# Patient Record
Sex: Female | Born: 1960 | Race: Black or African American | Hispanic: No | Smoking: Never smoker
Health system: Southern US, Community
[De-identification: ages and names within clinical notes are randomized; demographics above are authoritative.]

## PROBLEM LIST (undated history)

## (undated) DIAGNOSIS — M1990 Unspecified osteoarthritis, unspecified site: Secondary | ICD-10-CM

---

## 2001-02-05 ENCOUNTER — Ambulatory Visit (HOSPITAL_COMMUNITY): Admission: RE | Admit: 2001-02-05 | Discharge: 2001-02-05 | Payer: Self-pay | Admitting: Family Medicine

## 2001-02-05 ENCOUNTER — Encounter: Payer: Self-pay | Admitting: Family Medicine

## 2020-04-27 ENCOUNTER — Emergency Department (HOSPITAL_COMMUNITY): Payer: HRSA Program

## 2020-04-27 ENCOUNTER — Inpatient Hospital Stay (HOSPITAL_COMMUNITY)
Admission: EM | Admit: 2020-04-27 | Discharge: 2020-06-15 | DRG: 177 | Disposition: E | Payer: HRSA Program | Attending: Internal Medicine | Admitting: Internal Medicine

## 2020-04-27 ENCOUNTER — Ambulatory Visit
Admission: EM | Admit: 2020-04-27 | Discharge: 2020-04-27 | Disposition: A | Discharge status: Home / Self Care | Payer: Self-pay

## 2020-04-27 ENCOUNTER — Encounter: Payer: Self-pay | Admitting: Emergency Medicine

## 2020-04-27 ENCOUNTER — Encounter (HOSPITAL_COMMUNITY): Payer: Self-pay

## 2020-04-27 DIAGNOSIS — E669 Obesity, unspecified: Secondary | ICD-10-CM | POA: Diagnosis not present

## 2020-04-27 DIAGNOSIS — Z515 Encounter for palliative care: Secondary | ICD-10-CM

## 2020-04-27 DIAGNOSIS — J1282 Pneumonia due to coronavirus disease 2019: Secondary | ICD-10-CM | POA: Diagnosis present

## 2020-04-27 DIAGNOSIS — L89322 Pressure ulcer of left buttock, stage 2: Secondary | ICD-10-CM | POA: Diagnosis not present

## 2020-04-27 DIAGNOSIS — R6510 Systemic inflammatory response syndrome (SIRS) of non-infectious origin without acute organ dysfunction: Secondary | ICD-10-CM

## 2020-04-27 DIAGNOSIS — N179 Acute kidney failure, unspecified: Secondary | ICD-10-CM | POA: Diagnosis present

## 2020-04-27 DIAGNOSIS — L8990 Pressure ulcer of unspecified site, unspecified stage: Secondary | ICD-10-CM | POA: Insufficient documentation

## 2020-04-27 DIAGNOSIS — R32 Unspecified urinary incontinence: Secondary | ICD-10-CM | POA: Diagnosis not present

## 2020-04-27 DIAGNOSIS — Z66 Do not resuscitate: Secondary | ICD-10-CM | POA: Diagnosis not present

## 2020-04-27 DIAGNOSIS — F419 Anxiety disorder, unspecified: Secondary | ICD-10-CM | POA: Diagnosis present

## 2020-04-27 DIAGNOSIS — Z7189 Other specified counseling: Secondary | ICD-10-CM | POA: Diagnosis not present

## 2020-04-27 DIAGNOSIS — R7401 Elevation of levels of liver transaminase levels: Secondary | ICD-10-CM | POA: Diagnosis present

## 2020-04-27 DIAGNOSIS — Z6841 Body Mass Index (BMI) 40.0 and over, adult: Secondary | ICD-10-CM | POA: Diagnosis not present

## 2020-04-27 DIAGNOSIS — E860 Dehydration: Secondary | ICD-10-CM | POA: Diagnosis present

## 2020-04-27 DIAGNOSIS — Z880 Allergy status to penicillin: Secondary | ICD-10-CM

## 2020-04-27 DIAGNOSIS — L89312 Pressure ulcer of right buttock, stage 2: Secondary | ICD-10-CM | POA: Diagnosis not present

## 2020-04-27 DIAGNOSIS — M1990 Unspecified osteoarthritis, unspecified site: Secondary | ICD-10-CM | POA: Diagnosis present

## 2020-04-27 DIAGNOSIS — J9601 Acute respiratory failure with hypoxia: Secondary | ICD-10-CM | POA: Diagnosis present

## 2020-04-27 DIAGNOSIS — R5381 Other malaise: Secondary | ICD-10-CM | POA: Diagnosis present

## 2020-04-27 DIAGNOSIS — R197 Diarrhea, unspecified: Secondary | ICD-10-CM | POA: Diagnosis present

## 2020-04-27 DIAGNOSIS — R7989 Other specified abnormal findings of blood chemistry: Secondary | ICD-10-CM | POA: Diagnosis present

## 2020-04-27 DIAGNOSIS — R799 Abnormal finding of blood chemistry, unspecified: Secondary | ICD-10-CM

## 2020-04-27 DIAGNOSIS — E871 Hypo-osmolality and hyponatremia: Secondary | ICD-10-CM | POA: Diagnosis present

## 2020-04-27 DIAGNOSIS — J9600 Acute respiratory failure, unspecified whether with hypoxia or hypercapnia: Secondary | ICD-10-CM | POA: Diagnosis not present

## 2020-04-27 DIAGNOSIS — U071 COVID-19: Secondary | ICD-10-CM | POA: Diagnosis present

## 2020-04-27 HISTORY — DX: Unspecified osteoarthritis, unspecified site: M19.90

## 2020-04-27 LAB — CBC WITH DIFFERENTIAL/PLATELET
Abs Immature Granulocytes: 0.06 10*3/uL (ref 0.00–0.07)
Basophils Absolute: 0 10*3/uL (ref 0.0–0.1)
Basophils Relative: 0 %
Eosinophils Absolute: 0 10*3/uL (ref 0.0–0.5)
Eosinophils Relative: 0 %
HCT: 40.7 % (ref 36.0–46.0)
Hemoglobin: 12.9 g/dL (ref 12.0–15.0)
Immature Granulocytes: 1 %
Lymphocytes Relative: 14 %
Lymphs Abs: 0.9 10*3/uL (ref 0.7–4.0)
MCH: 29.2 pg (ref 26.0–34.0)
MCHC: 31.7 g/dL (ref 30.0–36.0)
MCV: 92.1 fL (ref 80.0–100.0)
Monocytes Absolute: 0.3 10*3/uL (ref 0.1–1.0)
Monocytes Relative: 5 %
Neutro Abs: 4.9 10*3/uL (ref 1.7–7.7)
Neutrophils Relative %: 80 %
Platelets: 155 10*3/uL (ref 150–400)
RBC: 4.42 MIL/uL (ref 3.87–5.11)
RDW: 13.8 % (ref 11.5–15.5)
WBC: 6.1 10*3/uL (ref 4.0–10.5)
nRBC: 0 % (ref 0.0–0.2)

## 2020-04-27 LAB — COMPREHENSIVE METABOLIC PANEL
ALT: 28 U/L (ref 0–44)
AST: 67 U/L — ABNORMAL HIGH (ref 15–41)
Albumin: 2.8 g/dL — ABNORMAL LOW (ref 3.5–5.0)
Alkaline Phosphatase: 43 U/L (ref 38–126)
Anion gap: 11 (ref 5–15)
BUN: 25 mg/dL — ABNORMAL HIGH (ref 6–20)
CO2: 24 mmol/L (ref 22–32)
Calcium: 7.9 mg/dL — ABNORMAL LOW (ref 8.9–10.3)
Chloride: 99 mmol/L (ref 98–111)
Creatinine, Ser: 1.36 mg/dL — ABNORMAL HIGH (ref 0.44–1.00)
GFR calc Af Amer: 49 mL/min — ABNORMAL LOW (ref >60)
GFR calc non Af Amer: 42 mL/min — ABNORMAL LOW (ref >60)
Glucose, Bld: 125 mg/dL — ABNORMAL HIGH (ref 70–99)
Potassium: 3.8 mmol/L (ref 3.5–5.1)
Sodium: 134 mmol/L — ABNORMAL LOW (ref 135–145)
Total Bilirubin: 0.4 mg/dL (ref 0.3–1.2)
Total Protein: 7.6 g/dL (ref 6.5–8.1)

## 2020-04-27 LAB — TRIGLYCERIDES: Triglycerides: 103 mg/dL (ref <150)

## 2020-04-27 LAB — D-DIMER, QUANTITATIVE: D-Dimer, Quant: 1.7 ug/mL-FEU — ABNORMAL HIGH (ref 0.00–0.50)

## 2020-04-27 LAB — FIBRINOGEN: Fibrinogen: 706 mg/dL — ABNORMAL HIGH (ref 210–475)

## 2020-04-27 LAB — LACTATE DEHYDROGENASE: LDH: 553 U/L — ABNORMAL HIGH (ref 98–192)

## 2020-04-27 LAB — LACTIC ACID, PLASMA: Lactic Acid, Venous: 1.7 mmol/L (ref 0.5–1.9)

## 2020-04-27 LAB — SARS CORONAVIRUS 2 BY RT PCR (HOSPITAL ORDER, PERFORMED IN ~~LOC~~ HOSPITAL LAB): SARS Coronavirus 2: POSITIVE — AB

## 2020-04-27 LAB — PROCALCITONIN: Procalcitonin: 0.27 ng/mL

## 2020-04-27 LAB — FERRITIN: Ferritin: 1457 ng/mL — ABNORMAL HIGH (ref 11–307)

## 2020-04-27 LAB — C-REACTIVE PROTEIN: CRP: 22.1 mg/dL — ABNORMAL HIGH (ref <1.0)

## 2020-04-27 MED ORDER — SODIUM CHLORIDE 0.9 % IV SOLN
100.0000 mg | INTRAVENOUS | Status: AC
  Administered 2020-04-27 (×2): 100 mg via INTRAVENOUS
  Filled 2020-04-27: qty 20

## 2020-04-27 MED ORDER — ACETAMINOPHEN 325 MG PO TABS: 650.0000 mg | ORAL_TABLET | Freq: Four times a day (QID) | ORAL | Status: DC | PRN

## 2020-04-27 MED ORDER — SODIUM CHLORIDE 0.9 % IV SOLN
100.0000 mg | Freq: Every day | INTRAVENOUS | Status: AC
  Administered 2020-04-28 – 2020-05-01 (×4): 100 mg via INTRAVENOUS
  Filled 2020-04-27 (×5): qty 20

## 2020-04-27 MED ORDER — DEXAMETHASONE SODIUM PHOSPHATE 10 MG/ML IJ SOLN
6.0000 mg | Freq: Once | INTRAMUSCULAR | Status: AC
  Administered 2020-04-27: 6 mg via INTRAVENOUS
  Filled 2020-04-27: qty 1

## 2020-04-27 MED ORDER — DM-GUAIFENESIN ER 30-600 MG PO TB12
1.0000 | ORAL_TABLET | Freq: Two times a day (BID) | ORAL | Status: DC
  Administered 2020-04-27 – 2020-05-14 (×32): 1 via ORAL
  Filled 2020-04-27 (×32): qty 1

## 2020-04-27 MED ORDER — SODIUM CHLORIDE 0.9 % IV SOLN: Freq: Once | INTRAVENOUS | Status: AC

## 2020-04-27 MED ORDER — LEVOFLOXACIN IN D5W 750 MG/150ML IV SOLN
750.0000 mg | Freq: Every day | INTRAVENOUS | Status: DC
  Administered 2020-04-28 – 2020-04-30 (×4): 750 mg via INTRAVENOUS
  Filled 2020-04-27 (×4): qty 150

## 2020-04-27 NOTE — H&P (Signed)
History and Physical  [name] RFF:[id number] DOB: 09-Dec-1960 DOA: 05/11/2020  Referring physician: Raeford Razor, MD PCP: Patient, No Pcp Per  Patient coming from: Home  Chief Complaint: Shortness of breath  HPI: Lisa Diaz is a 59 y.o. female with medical history significant for obesity and arthritis who presents to the emergency department via EMS due to a 4-5 days of shortness of breath.  Patient complained of nonproductive cough, fever, decreased appetite, loss of appetite and loss of sense of smell, diarrhea (3-4 episodes daily) and increased work of breathing which resulted in worsening shortness of breath today and she presented to an urgent care for evaluation.  EMS was activated at the urgent care and on arrival, O2 sat was noted to be 60% on room air, she was placed on supplemental oxygen via NRB at 15 L with improvement in O2 sat to 95%.  Patient was taken to the ED for further evaluation management.  Patient states that she has not had Covid vaccine.  ED Course:  In the emergency department, she was noted to be febrile, tachycardic and tachypneic.  Work-up in the ED showed normal CBC, BUN/creatinine 25/1.36, ferritin 1457, CRP 221, AST 67, Albumin 2.8, LDH 553, D-dimer 1.70, fibrinogen 706, procalcitonin 0.27.  SARS coronavirus 2 was positive.  Chest x-ray showed multiple small ill-defined opacities throughout both lungs with concern for possible multifocal pneumonia.  She was started on IV Decadron and remdesivir.  Hospitalist was asked to admit patient for further evaluation management.  Review of Systems: Constitutional: Negative for chills and fever.  HENT: Negative for ear pain and sore throat.   Eyes: Negative for pain and visual disturbance.  Respiratory: Positive for cough (nonproductive) and shortness of breath.   Cardiovascular: Negative for chest pain and palpitations.  Gastrointestinal: Positive for diarrhea.  Negative for abdominal pain and vomiting.   Endocrine: Negative for polyphagia and polyuria.  Genitourinary: Negative for decreased urine volume, dysuria Musculoskeletal: Negative for arthralgias and back pain.  Skin: Negative for color change and rash.  Allergic/Immunologic: Negative for immunocompromised state.  Neurological: Negative for tremors, syncope, speech difficulty, weakness, light-headedness and headaches.  Hematological: Does not bruise/bleed easily.  All other systems reviewed and are negative   Past Medical History:  Diagnosis Date  . Arthritis    History reviewed. No pertinent surgical history.  Social History:  reports that she has never smoked. She has never used smokeless tobacco. She reports that she does not drink alcohol and does not use drugs.   Allergies  Allergen Reactions  . Penicillins     Did it involve swelling of the face/tongue/throat, SOB, or low BP? yes Did it involve sudden or severe rash/hives, skin peeling, or any reaction on the inside of your mouth or nose? yes Did you need to seek medical attention at a hospital or doctor's office? no When did it last happen?"years ago" If all above answers are "NO", may proceed with cephalosporin use.     No family history on file.   Prior to Admission medications   Medication Sig Start Date End Date Taking? Authorizing Provider  acetaminophen (TYLENOL) 500 MG tablet Take 1,000 mg by mouth every 6 (six) hours as needed for mild pain or fever.   Yes [provider]  ibuprofen (ADVIL) 200 MG tablet Take 800 mg by mouth every 6 (six) hours as needed for fever or moderate pain.   Yes [provider]  naproxen sodium (ALEVE) 220 MG tablet Take 220 mg by  mouth daily as needed (pain/fever).   Yes [provider]    Physical Exam: BP 124/66   Pulse 86   Temp 100.2 F (37.9 C) (Oral)   Resp (!) 36   Ht 5\' 8"  (1.727 m)   Wt (!) 158.8 kg   SpO2 93%   BMI 53.22 kg/m   . General: 59 y.o. year-old female obese and  in no acute distress.  Alert and oriented x3. 46 HEENT: Dry mucous membrane, NCAT, EOMI . Neck: Supple, trachea midline . Cardiovascular: Regular rate and rhythm with no rubs or gallops.  No thyromegaly or JVD noted.  No lower extremity edema. 2/4 pulses in all 4 extremities. Marland Kitchen Respiratory: Tachypnea.  Clear to auscultation with no wheezes or rales.  . Abdomen: Soft nontender nondistended with normal bowel sounds x4 quadrants. . Muskuloskeletal: No cyanosis, clubbing or edema noted bilaterally . Neuro: CN II-XII intact, strength, sensation, reflexes . Skin: No ulcerative lesions noted or rashes . Psychiatry: Judgement and insight appear normal. Mood is appropriate for condition and setting          Labs on Admission:  Basic Metabolic Panel: Recent Labs  Lab 04/22/2020 1920  NA 134*  K 3.8  CL 99  CO2 24  GLUCOSE 125*  BUN 25*  CREATININE 1.36*  CALCIUM 7.9*   Liver Function Tests: Recent Labs  Lab 04/29/2020 1920  AST 67*  ALT 28  ALKPHOS 43  BILITOT 0.4  PROT 7.6  ALBUMIN 2.8*   No results for input(s): LIPASE, AMYLASE in the last 168 hours. No results for input(s): AMMONIA in the last 168 hours. CBC: Recent Labs  Lab 04/20/2020 1920  WBC 6.1  NEUTROABS 4.9  HGB 12.9  HCT 40.7  MCV 92.1  PLT 155   Cardiac Enzymes: No results for input(s): CKTOTAL, CKMB, CKMBINDEX, TROPONINI in the last 168 hours.  BNP (last 3 results) No results for input(s): BNP in the last 8760 hours.  ProBNP (last 3 results) No results for input(s): PROBNP in the last 8760 hours.  CBG: No results for input(s): GLUCAP in the last 168 hours.  Radiological Exams on Admission: DG Chest Port 1 View  Result Date: 04/19/2020 CLINICAL DATA:  Shortness of breath. EXAM: PORTABLE CHEST 1 VIEW COMPARISON:  None. FINDINGS: Mild cardiomegaly is noted. No pneumothorax is noted. Multiple small ill-defined opacities are noted throughout both lungs concerning for possible multifocal pneumonia. Left  pleural effusion cannot be excluded. Bony thorax is unremarkable. IMPRESSION: Multiple small ill-defined opacities are noted throughout both lungs concerning for possible multifocal pneumonia. Left pleural effusion cannot be excluded. Electronically Signed   By: 04/29/2020 M.D.   On: 04/25/2020 19:42    EKG: I independently viewed the EKG done and my findings are as followed: Sinus rhythm at rate of 85 bpm  Assessment/Plan Present on Admission: . Pneumonia due to COVID-19 virus  Principal Problem:   Pneumonia due to COVID-19 virus Active Problems:   Acute respiratory failure with hypoxia (HCC)   Arthritis   Super obese   Elevated serum creatinine   Elevated BUN   SIRS (systemic inflammatory response syndrome) (HCC)   Diarrhea   Elevated AST (SGOT)   Acute respiratory failure with hypoxia secondary to COVID-19 virus pneumonia with possible superimposed CAP POA Chest x-ray showed multiple small ill-defined opacities throughout both lungs with concern for possible multifocal pneumonia Continue albuterol q.6h Continue IV Solu-Medrol  Continue IV Remdesivir per pharmacy protocol Continue vitamin-C 500 mg p.o. Daily Continue zinc  220 mg p.o. Daily Continue Mucinex, Robitussin and Tussionex Continue Tylenol p.r.n. for fever IV levofloxacin will be given due to possible bacteria pneumonia considering elevated procalcitonin (0.27, which is >0.25) Continue supplemental oxygen to maintain O2 sat > or = 94% with plan to wean patient off supplemental oxygen as tolerated (of note, patient does not use oxygen at baseline) Continue incentive spirometry and flutter valve q80min as tolerated Encourage proning, early ambulation, and side laying as tolerated Continue airborne isolation precaution Inflammatory markers: LDH: 553 CRP:  221 D-dimer:1.70 Fibrinogen: 706 Continue monitoring daily inflammatory markers Physician PPE:  Surgical mask with face shield, N-95, nonsterile gloves,  disposable gown, head and shoe cover s Patient PPE:  Face mask   SIRS positive with no convincing evidence of sepsis Patient was noted to be febrile and tachypneic (meet SIRS criteria).  Tachypnea appeared to be driven by patient's hypoxic state and a transitory tachycardia due to fever.  Patient does not appear septic clinically Continue to monitor as per above  Diarrhea possibly secondary to COVID-19 virus infection This is possibly secondary to COVID-19 virus infection, but C. difficile will be obtained with plan to initiate treatment if positive.  Elevated AST AST 67, continue to monitor liver enzymes considering patient being on remdesivir  Elevated BUN/Creatinine with no known history of CKD Dehydration BUN/creatinine -25/1.36; gentle hydration will be provided  Arthritis Continue Tylenol as needed  Super obese (BMI 53.22) Patient will be counseled on diet and exercise when more stable Patient will need to follow-up with primary care physician for weight loss program   DVT prophylaxis: Lovenox  Code Status: Full code  Family Communication: None at bedside  Disposition Plan:  Patient is from:                        home Anticipated DC to:                   SNF or family members home Anticipated DC date:               2-3 days Anticipated DC barriers:         Patient unstable to discharge at this time due to hypoxia respiratory failure secondary to COVID-19 virus pneumonia requiring IV treatment and supplemental oxygen   Consults called: None  Admission status: Inpatient    Frankey Shown MD [facility]  If 7PM-7AM, please contact night-coverage [email or web address] Password [facility]  05/01/2020, 11:06 PM

## 2020-04-27 NOTE — ED Triage Notes (Signed)
Pt came in with severe shortness of breath. R/a o2 sats 60%.  Pt reports her and her kids have recently had a virus. Has not been tested for covid.

## 2020-04-27 NOTE — ED Triage Notes (Signed)
Pt. Arrived via EMS from urgent care. Pt. Complains of SHOB, headache, diarrhea, decreased appetite for 5 days. Per EMS Pts. O2 was 60% on RA at urgent care. Pt. Placed on 15 L of o2 and sats went to 82%. Pt. Currently on 15L non rebreather and O2 sats are 95.

## 2020-04-27 NOTE — ED Notes (Signed)
Patient is being discharged from the Urgent Care and sent to the Emergency Department via ambulance. Per [facility] patient is in need of higher level of care due to Resp failure . Patient is aware and verbalizes understanding of plan of care.  Vitals:   May 18, 2020 1831  BP: 116/75  Pulse: (!) 109  Resp: (!) 29  Temp: (!) 102.9 F (39.4 C)  SpO2: (!) 60%

## 2020-04-27 NOTE — Progress Notes (Signed)
Pharmacy Antibiotic Note  Lisa Diaz is a 59 y.o. female admitted on 05-21-2020 with CAP. Pt also with COVID.  Pharmacy has been consulted for Levaquin dosing.  Plan: Levaquin 750mg  IV q24h Will f/u micro data, pt's clinical condition, and renal function  Height: 5\' 8"  (172.7 cm) Weight: (!) 158.8 kg (350 lb) IBW/kg (Calculated) : 63.9  Temp (24hrs), Avg:101.6 F (38.7 C), Min:100.2 F (37.9 C), Max:102.9 F (39.4 C)  Recent Labs  Lab 05-21-2020 1920 21-May-2020 1948  WBC 6.1  --   CREATININE 1.36*  --   LATICACIDVEN  --  1.7    Estimated Creatinine Clearance: 71.6 mL/min (A) (by C-G formula based on SCr of 1.36 mg/dL (H)).    Allergies  Allergen Reactions  . Penicillins     Did it involve swelling of the face/tongue/throat, SOB, or low BP? yes Did it involve sudden or severe rash/hives, skin peeling, or any reaction on the inside of your mouth or nose? yes Did you need to seek medical attention at a hospital or doctor's office? no When did it last happen?"years ago" If all above answers are "NO", may proceed with cephalosporin use.     Antimicrobials this admission: 9/13 Levaquin >>   Microbiology results: 9/13 BCx:  9/13 SARS coronavirus 2: positive Cdiff PCR:  Thank you for allowing pharmacy to be a part of this patient's care.  10/13, PharmD, BCPS Please see amion for complete clinical pharmacist phone list 05/21/20 11:13 PM

## 2020-04-27 NOTE — ED Provider Notes (Signed)
[facility] EMERGENCY DEPARTMENT Provider Note   CSN: [id number] Arrival date & time: 23-May-2020  1858     History Chief Complaint  Patient presents with  . Shortness of Breath    Lisa Diaz is a 59 y.o. female.  HPI   59 year old female with Covid-like illness.  She began feeling poorly about 4 to 5 days ago.  Body aches.  Headache.  Diarrhea.  Poor appetite.  Shortness of breath worsening over the past few days.  Nonproductive cough.  She not receive her Covid vaccinations.  Past Medical History:  Diagnosis Date  . Arthritis     There are no problems to display for this patient.   History reviewed. No pertinent surgical history.   OB History   No obstetric history on file.     No family history on file.  Social History   Tobacco Use  . Smoking status: Never Smoker  . Smokeless tobacco: Never Used  Substance Use Topics  . Alcohol use: Never  . Drug use: Never    Home Medications Prior to Admission medications   Not on File    Allergies    Penicillins  Review of Systems   Review of Systems All systems reviewed and negative, other than as noted in HPI.  Physical Exam Updated Vital Signs BP 133/64   Pulse 94   Temp 100.2 F (37.9 C) (Oral)   Resp (!) 23   Ht 5\' 8"  (1.727 m)   Wt (!) 158.8 kg   SpO2 95%   BMI 53.22 kg/m   Physical Exam Vitals and nursing note reviewed.  Constitutional:      Appearance: She is obese.     Comments: Laying in bed.  Appears tired, but nontoxic.  HENT:     Head: Normocephalic and atraumatic.  Eyes:     General:        Right eye: No discharge.        Left eye: No discharge.     Conjunctiva/sclera: Conjunctivae normal.  Cardiovascular:     Rate and Rhythm: Normal rate and regular rhythm.     Heart sounds: Normal heart sounds. No murmur heard.  No friction rub. No gallop.   Pulmonary:     Effort: Pulmonary effort is normal. No respiratory distress.     Breath sounds: Normal breath sounds.    Abdominal:     General: There is no distension.     Palpations: Abdomen is soft.     Tenderness: There is no abdominal tenderness.  Musculoskeletal:        General: No tenderness.     Cervical back: Neck supple.  Skin:    General: Skin is warm and dry.  Neurological:     Mental Status: She is alert.  Psychiatric:        Behavior: Behavior normal.        Thought Content: Thought content normal.     ED Results / Procedures / Treatments   Labs (all labs ordered are listed, but only abnormal results are displayed) Labs Reviewed  SARS CORONAVIRUS 2 BY RT PCR (HOSPITAL ORDER, PERFORMED IN [facility]) - Abnormal; Notable for the following components:      Result Value   SARS Coronavirus 2 POSITIVE (*)    All other components within normal limits  COMPREHENSIVE METABOLIC PANEL - Abnormal; Notable for the following components:   Sodium 134 (*)    Glucose, Bld 125 (*)    BUN 25 (*)  Creatinine, Ser 1.36 (*)    Calcium 7.9 (*)    Albumin 2.8 (*)    AST 67 (*)    GFR calc non Af Amer 42 (*)    GFR calc Af Amer 49 (*)    All other components within normal limits  D-DIMER, QUANTITATIVE (NOT AT [facility]) - Abnormal; Notable for the following components:   D-Dimer, Quant 1.70 (*)    All other components within normal limits  LACTATE DEHYDROGENASE - Abnormal; Notable for the following components:   LDH 553 (*)    All other components within normal limits  FERRITIN - Abnormal; Notable for the following components:   Ferritin 1,457 (*)    All other components within normal limits  FIBRINOGEN - Abnormal; Notable for the following components:   Fibrinogen 706 (*)    All other components within normal limits  C-REACTIVE PROTEIN - Abnormal; Notable for the following components:   CRP 22.1 (*)    All other components within normal limits  CULTURE, BLOOD (ROUTINE X 2)  CULTURE, BLOOD (ROUTINE X 2)  LACTIC ACID, PLASMA  CBC WITH DIFFERENTIAL/PLATELET  PROCALCITONIN   TRIGLYCERIDES  LACTIC ACID, PLASMA    EKG None  Radiology DG Chest Port 1 View  Result Date: 05/07/20 CLINICAL DATA:  Shortness of breath. EXAM: PORTABLE CHEST 1 VIEW COMPARISON:  None. FINDINGS: Mild cardiomegaly is noted. No pneumothorax is noted. Multiple small ill-defined opacities are noted throughout both lungs concerning for possible multifocal pneumonia. Left pleural effusion cannot be excluded. Bony thorax is unremarkable. IMPRESSION: Multiple small ill-defined opacities are noted throughout both lungs concerning for possible multifocal pneumonia. Left pleural effusion cannot be excluded. Electronically Signed   By: Lupita Raider M.D.   On: 05/07/2020 19:42    Procedures Procedures (including critical care time)  CRITICAL CARE Performed by: Raeford Razor Total critical care time: 35 minutes Critical care time was exclusive of separately billable procedures and treating other patients. Critical care was necessary to treat or prevent imminent or life-threatening deterioration. Critical care was time spent personally by me on the following activities: development of treatment plan with patient and/or surrogate as well as nursing, discussions with consultants, evaluation of patient's response to treatment, examination of patient, obtaining history from patient or surrogate, ordering and performing treatments and interventions, ordering and review of laboratory studies, ordering and review of radiographic studies, pulse oximetry and re-evaluation of patient's condition.   Medications Ordered in ED Medications - No data to display  ED Course  I have reviewed the triage vital signs and the nursing notes.  Pertinent labs & imaging results that were available during my care of the patient were reviewed by me and considered in my medical decision making (see chart for details).    MDM Rules/Calculators/A&P                          59yF with symptoms consistent with COVID.  Hypoxic requiring 15 L/min. Morbidly obese. Will need admission.  Lisa Diaz was evaluated in Emergency Department on 07-May-2020 for the symptoms described in the history of present illness. She was evaluated in the context of the global COVID-19 pandemic, which necessitated consideration that the patient might be at risk for infection with the SARS-CoV-2 virus that causes COVID-19. Institutional protocols and algorithms that pertain to the evaluation of patients at risk for COVID-19 are in a state of rapid change based on information released by regulatory bodies including the CDC  and federal and state organizations. These policies and algorithms were followed during the patient's care in the ED.  Final Clinical Impression(s) / ED Diagnoses Final diagnoses:  COVID-19 virus infection  Acute respiratory failure with hypoxia [facility])    Rx / DC Orders ED Discharge Orders    None       Raeford Razor, MD 05/03/2020 2209

## 2020-04-28 DIAGNOSIS — J9600 Acute respiratory failure, unspecified whether with hypoxia or hypercapnia: Secondary | ICD-10-CM | POA: Diagnosis present

## 2020-04-28 LAB — CBC WITH DIFFERENTIAL/PLATELET
Abs Immature Granulocytes: 0.02 10*3/uL (ref 0.00–0.07)
Basophils Absolute: 0 10*3/uL (ref 0.0–0.1)
Basophils Relative: 1 %
Eosinophils Absolute: 0 10*3/uL (ref 0.0–0.5)
Eosinophils Relative: 0 %
HCT: 42.1 % (ref 36.0–46.0)
Hemoglobin: 13.3 g/dL (ref 12.0–15.0)
Immature Granulocytes: 0 %
Lymphocytes Relative: 9 %
Lymphs Abs: 0.6 10*3/uL — ABNORMAL LOW (ref 0.7–4.0)
MCH: 29 pg (ref 26.0–34.0)
MCHC: 31.6 g/dL (ref 30.0–36.0)
MCV: 91.9 fL (ref 80.0–100.0)
Monocytes Absolute: 0.2 10*3/uL (ref 0.1–1.0)
Monocytes Relative: 3 %
Neutro Abs: 5.5 10*3/uL (ref 1.7–7.7)
Neutrophils Relative %: 87 %
Platelets: 148 10*3/uL — ABNORMAL LOW (ref 150–400)
RBC: 4.58 MIL/uL (ref 3.87–5.11)
RDW: 13.9 % (ref 11.5–15.5)
WBC: 6.4 10*3/uL (ref 4.0–10.5)
nRBC: 0 % (ref 0.0–0.2)

## 2020-04-28 LAB — COMPREHENSIVE METABOLIC PANEL
ALT: 32 U/L (ref 0–44)
AST: 81 U/L — ABNORMAL HIGH (ref 15–41)
Albumin: 2.6 g/dL — ABNORMAL LOW (ref 3.5–5.0)
Alkaline Phosphatase: 41 U/L (ref 38–126)
Anion gap: 10 (ref 5–15)
BUN: 24 mg/dL — ABNORMAL HIGH (ref 6–20)
CO2: 22 mmol/L (ref 22–32)
Calcium: 7.7 mg/dL — ABNORMAL LOW (ref 8.9–10.3)
Chloride: 103 mmol/L (ref 98–111)
Creatinine, Ser: 0.99 mg/dL (ref 0.44–1.00)
GFR calc Af Amer: >60 mL/min (ref >60)
GFR calc non Af Amer: >60 mL/min (ref >60)
Glucose, Bld: 153 mg/dL — ABNORMAL HIGH (ref 70–99)
Potassium: 4.1 mmol/L (ref 3.5–5.1)
Sodium: 135 mmol/L (ref 135–145)
Total Bilirubin: 0.6 mg/dL (ref 0.3–1.2)
Total Protein: 7.2 g/dL (ref 6.5–8.1)

## 2020-04-28 LAB — PHOSPHORUS: Phosphorus: 2.6 mg/dL (ref 2.5–4.6)

## 2020-04-28 LAB — FERRITIN: Ferritin: 2305 ng/mL — ABNORMAL HIGH (ref 11–307)

## 2020-04-28 LAB — BRAIN NATRIURETIC PEPTIDE: B Natriuretic Peptide: 44 pg/mL (ref 0.0–100.0)

## 2020-04-28 LAB — D-DIMER, QUANTITATIVE: D-Dimer, Quant: 2.27 ug/mL-FEU — ABNORMAL HIGH (ref 0.00–0.50)

## 2020-04-28 LAB — C-REACTIVE PROTEIN: CRP: 22.8 mg/dL — ABNORMAL HIGH (ref <1.0)

## 2020-04-28 LAB — MAGNESIUM: Magnesium: 2 mg/dL (ref 1.7–2.4)

## 2020-04-28 LAB — HIV ANTIBODY (ROUTINE TESTING W REFLEX): HIV Screen 4th Generation wRfx: NONREACTIVE

## 2020-04-28 MED ORDER — MELATONIN 3 MG PO TABS
3.0000 mg | ORAL_TABLET | Freq: Once | ORAL | Status: AC
  Administered 2020-04-28: 3 mg via ORAL
  Filled 2020-04-28: qty 1

## 2020-04-28 MED ORDER — METHYLPREDNISOLONE SODIUM SUCC 125 MG IJ SOLR
0.5000 mg/kg | Freq: Two times a day (BID) | INTRAMUSCULAR | Status: DC
  Administered 2020-04-28: 79.375 mg via INTRAVENOUS
  Filled 2020-04-28: qty 2

## 2020-04-28 MED ORDER — ZINC SULFATE 220 (50 ZN) MG PO CAPS
220.0000 mg | ORAL_CAPSULE | Freq: Every day | ORAL | Status: DC
  Administered 2020-04-28 – 2020-05-14 (×17): 220 mg via ORAL
  Filled 2020-04-28 (×17): qty 1

## 2020-04-28 MED ORDER — HYDROCOD POLST-CPM POLST ER 10-8 MG/5ML PO SUER
5.0000 mL | Freq: Two times a day (BID) | ORAL | Status: DC | PRN
  Administered 2020-05-01 – 2020-05-13 (×7): 5 mL via ORAL
  Filled 2020-04-28 (×7): qty 5

## 2020-04-28 MED ORDER — ENOXAPARIN SODIUM 80 MG/0.8ML ~~LOC~~ SOLN
80.0000 mg | SUBCUTANEOUS | Status: DC
  Administered 2020-04-28 – 2020-05-02 (×5): 80 mg via SUBCUTANEOUS
  Filled 2020-04-28 (×5): qty 0.8

## 2020-04-28 MED ORDER — GUAIFENESIN-DM 100-10 MG/5ML PO SYRP
10.0000 mL | ORAL_SOLUTION | ORAL | Status: DC | PRN
  Administered 2020-05-13: 10 mL via ORAL
  Filled 2020-04-28: qty 10

## 2020-04-28 MED ORDER — ONDANSETRON HCL 4 MG/2ML IJ SOLN: 4.0000 mg | Freq: Four times a day (QID) | INTRAMUSCULAR | Status: DC | PRN

## 2020-04-28 MED ORDER — SODIUM CHLORIDE 0.9 % IV SOLN
160.0000 mg | Freq: Two times a day (BID) | INTRAVENOUS | Status: DC
  Administered 2020-04-28 – 2020-05-08 (×21): 160 mg via INTRAVENOUS
  Filled 2020-04-28 (×27): qty 1.28

## 2020-04-28 MED ORDER — METHYLPREDNISOLONE SODIUM SUCC 40 MG IJ SOLR
INTRAMUSCULAR | Status: AC
  Filled 2020-04-28: qty 4

## 2020-04-28 MED ORDER — ALBUTEROL SULFATE HFA 108 (90 BASE) MCG/ACT IN AERS
2.0000 | INHALATION_SPRAY | Freq: Four times a day (QID) | RESPIRATORY_TRACT | Status: DC
  Administered 2020-04-28 – 2020-05-01 (×14): 2 via RESPIRATORY_TRACT
  Filled 2020-04-28: qty 6.7

## 2020-04-28 MED ORDER — PREDNISONE 50 MG PO TABS: 50.0000 mg | ORAL_TABLET | Freq: Every day | ORAL | Status: DC

## 2020-04-28 MED ORDER — BARICITINIB 2 MG PO TABS
4.0000 mg | ORAL_TABLET | Freq: Every day | ORAL | Status: AC
  Administered 2020-04-28 – 2020-05-11 (×14): 4 mg via ORAL
  Filled 2020-04-28 (×14): qty 2

## 2020-04-28 MED ORDER — ACETAMINOPHEN 325 MG PO TABS
650.0000 mg | ORAL_TABLET | Freq: Four times a day (QID) | ORAL | Status: DC | PRN
  Administered 2020-05-10 – 2020-05-13 (×2): 650 mg via ORAL
  Filled 2020-04-28 (×2): qty 2

## 2020-04-28 MED ORDER — ASCORBIC ACID 500 MG PO TABS
500.0000 mg | ORAL_TABLET | Freq: Every day | ORAL | Status: DC
  Administered 2020-04-28 – 2020-05-14 (×17): 500 mg via ORAL
  Filled 2020-04-28 (×17): qty 1

## 2020-04-28 NOTE — Progress Notes (Signed)
PROGRESS NOTE  Lisa Diaz DDU:[id number] DOB: May 04, 1961 DOA: 04/20/2020 PCP: Patient, No Pcp Per  Brief History:  59 year old female with no documented chronic medical problems presenting with 1 week history of nonproductive cough, poor oral intake, shortness of breath, myalgias, and headache with associated loose stools.  The patient states that in the past 2 days her coughing and shortness of breath and generalized weakness had worsened.  In addition, she has lost her sense of smell.  The patient has been complaining of loose stools for the past 3 days.  She has had 3-4 loose bowel movements daily.  She denies any hematochezia but complains of some black stool.  She states that she has been taking Pepto-Bismol.  The patient has been having some subjective fevers and chills.  She has not been vaccinated against COVID-19.  She lives with her foster daughter who had some type of viral infection 2 weeks prior to the patient's admission.  The patient went to urgent care on 04/28/2020.  She was noted to have oxygen saturation in the 60s.  She was sent to emergency department for further evaluation. In the emergency department, the patient had temperature up to 102.9 F.  She was hemodynamically stable.  Oxygen saturation was 88-90% on a nonrebreather.  The patient was started on Solu-Medrol and remdesivir.  Assessment/Plan: Acute respiratory failure secondary to COVID-19 pneumonia. -Continue IV Solu-Medrol and remdesivir -Increased her Solu-Medrol dosing -Vitamin C and zinc -Encourage prone position -Incentive spirometry -Start baricitinib -CRP 22.1 -Ferritin 1457 -D-dimer 1.70 -PCT 0.27 -Personally reviewed chest x-ray--bilateral patchy infiltrates -Personally reviewed EKG--sinus rhythm, no ST-T wave changes  Renal insufficiency, type unspecified -No prior values to compare -Presented with serum creatinine 1.36 -am BMP  Diarrhea -Stool pathogen panel -C.  Difficile  Morbid obesity -BMI 53.22 -At risk for increased morbidity and mortality from COVID-19 -lifestyle modification  Hyponatremia -Secondary to volume depletion and poor solute intake -Judicious IV fluids      Status is: Inpatient  Remains inpatient appropriate because:IV treatments appropriate due to intensity of illness or inability to take PO   Dispo: The patient is from: Home              Anticipated d/c is to: Home              Anticipated d/c date is: > 3 days              Patient currently is not medically stable to d/c.        Family Communication:   No Family at bedside  Consultants:  none  Code Status:  FULL   DVT Prophylaxis:   [place] Lovenox   Procedures: As Listed in Progress Note Above  Antibiotics: None       Subjective: Patient states that her breathing is low but better than yesterday.  She is dyspneic with any exertion.  She has nonproductive cough as anorexia.  She has some nausea without emesis.  She continues to have loose stool without hematochezia.  She denies any chest pain or abdominal pain.  There is no headache or visual disturbance.  Objective: Vitals:   04/28/20 0530 04/28/20 0600 04/28/20 0630 04/28/20 0700  BP: 115/79 110/67 119/81 129/86  Pulse: 76  78 83  Resp: (!) 0 13 15 (!) 21  Temp:   99.8 F (37.7 C)   TempSrc:   Oral   SpO2: 99%  90% (!) 86%  Weight:  Height:        Intake/Output Summary (Last 24 hours) at 04/28/2020 0721 Last data filed at 04/28/2020 0400 Gross per 24 hour  Intake 151.35 ml  Output --  Net 151.35 ml   Weight change:  Exam:   General:  Pt is alert, follows commands appropriately, not in acute distress  HEENT: No icterus, No thrush, No neck mass, [place]/AT  Cardiovascular: RRR, S1/S2, no rubs, no gallops  Respiratory: Bilateral rales.  No wheezing.  Good air movement  Abdomen: Soft/+BS, non tender, non distended, no guarding  Extremities: Non pitting edema, No lymphangitis,  No petechiae, No rashes, no synovitis   Data Reviewed: I have personally reviewed following labs and imaging studies Basic Metabolic Panel: Recent Labs  Lab 05/02/2020 1920  NA 134*  K 3.8  CL 99  CO2 24  GLUCOSE 125*  BUN 25*  CREATININE 1.36*  CALCIUM 7.9*   Liver Function Tests: Recent Labs  Lab 04/29/2020 1920  AST 67*  ALT 28  ALKPHOS 43  BILITOT 0.4  PROT 7.6  ALBUMIN 2.8*   No results for input(s): LIPASE, AMYLASE in the last 168 hours. No results for input(s): AMMONIA in the last 168 hours. Coagulation Profile: No results for input(s): INR, PROTIME in the last 168 hours. CBC: Recent Labs  Lab 04/20/2020 1920  WBC 6.1  NEUTROABS 4.9  HGB 12.9  HCT 40.7  MCV 92.1  PLT 155   Cardiac Enzymes: No results for input(s): CKTOTAL, CKMB, CKMBINDEX, TROPONINI in the last 168 hours. BNP: Invalid input(s): POCBNP CBG: No results for input(s): GLUCAP in the last 168 hours. HbA1C: No results for input(s): HGBA1C in the last 72 hours. Urine analysis: No results found for: COLORURINE, APPEARANCEUR, LABSPEC, PHURINE, GLUCOSEU, HGBUR, BILIRUBINUR, KETONESUR, PROTEINUR, UROBILINOGEN, NITRITE, LEUKOCYTESUR Sepsis Labs: @LABRCNTIP (procalcitonin:4,lacticidven:4) ) Recent Results (from the past 240 hour(s))  SARS Coronavirus 2 by RT PCR (hospital order, performed in [facility] hospital lab) Nasopharyngeal Nasopharyngeal Swab     Status: Abnormal   Collection Time: 05/13/2020  7:20 PM   Specimen: Nasopharyngeal Swab  Result Value Ref Range Status   SARS Coronavirus 2 POSITIVE (A) NEGATIVE Final    Comment: RESULT CALLED TO, READ BACK BY AND VERIFIED WITH: T WALKER,RN@2149  05/06/2020 MKELLY (NOTE) SARS-CoV-2 target nucleic acids are DETECTED  SARS-CoV-2 RNA is generally detectable in upper respiratory specimens  during the acute phase of infection.  Positive results are indicative  of the presence of the identified virus, but do not rule out bacterial infection or  co-infection with other pathogens not detected by the test.  Clinical correlation with patient history and  other diagnostic information is necessary to determine patient infection status.  The expected result is negative.  Fact Sheet for Patients:   04/29/20   Fact Sheet for Healthcare Providers:   BoilerBrush.com.cy    This test is not yet approved or cleared by the https://pope.com/ FDA and  has been authorized for detection and/or diagnosis of SARS-CoV-2 by FDA under an Emergency Use Authorization (EUA).  This EUA will remain in effect (meaning this test  can be used) for the duration of  the COVID-19 declaration under Section 564(b)(1) of the Act, 21 U.S.C. section 360-bbb-3(b)(1), unless the authorization is terminated or revoked sooner.  Performed at [facility], [address], [place], [place]   Blood Culture (routine x 2)     Status: None (Preliminary result)   Collection Time: 05/07/2020  8:37 PM   Specimen: BLOOD LEFT HAND  Result Value Ref Range Status   Specimen Description BLOOD LEFT HAND  Final   Special Requests   Final    BOTTLES DRAWN AEROBIC AND ANAEROBIC Blood Culture adequate volume Performed at [facility], [address], [place], [place] [address]    Culture PENDING  Incomplete   Report Status PENDING  Incomplete     Scheduled Meds: . albuterol  2 puff Inhalation Q6H  . vitamin C  500 mg Oral Daily  . dextromethorphan-guaiFENesin  1 tablet Oral BID  . enoxaparin (LOVENOX) injection  80 mg Subcutaneous Q24H  . methylPREDNISolone (SOLU-MEDROL) injection  0.5 mg/kg Intravenous Q12H   Followed by  . [START ON 05/01/2020] predniSONE  50 mg Oral Daily  . zinc sulfate  220 mg Oral Daily   Continuous Infusions: . levofloxacin (LEVAQUIN) IV Stopped (04/28/20 0400)  . remdesivir 100 mg in NS 100 mL      Procedures/Studies: DG Chest Port 1 View  Result Date: May 22, 2020 CLINICAL DATA:   Shortness of breath. EXAM: PORTABLE CHEST 1 VIEW COMPARISON:  None. FINDINGS: Mild cardiomegaly is noted. No pneumothorax is noted. Multiple small ill-defined opacities are noted throughout both lungs concerning for possible multifocal pneumonia. Left pleural effusion cannot be excluded. Bony thorax is unremarkable. IMPRESSION: Multiple small ill-defined opacities are noted throughout both lungs concerning for possible multifocal pneumonia. Left pleural effusion cannot be excluded. Electronically Signed   By: Lupita Raider M.D.   On: 05-22-20 19:42    Catarina Hartshorn, DO  [facility]  If 7PM-7AM, please contact night-coverage [email or web address] Password [email or web address] 04/28/2020, 7:21 AM   LOS: 1 day

## 2020-04-29 DIAGNOSIS — Z66 Do not resuscitate: Secondary | ICD-10-CM | POA: Diagnosis present

## 2020-04-29 LAB — FERRITIN: Ferritin: 3063 ng/mL — ABNORMAL HIGH (ref 11–307)

## 2020-04-29 LAB — CBC WITH DIFFERENTIAL/PLATELET
Abs Immature Granulocytes: 0.04 10*3/uL (ref 0.00–0.07)
Basophils Absolute: 0 10*3/uL (ref 0.0–0.1)
Basophils Relative: 0 %
Eosinophils Absolute: 0 10*3/uL (ref 0.0–0.5)
Eosinophils Relative: 0 %
HCT: 42.6 % (ref 36.0–46.0)
Hemoglobin: 13.4 g/dL (ref 12.0–15.0)
Immature Granulocytes: 1 %
Lymphocytes Relative: 16 %
Lymphs Abs: 1.2 10*3/uL (ref 0.7–4.0)
MCH: 28.8 pg (ref 26.0–34.0)
MCHC: 31.5 g/dL (ref 30.0–36.0)
MCV: 91.4 fL (ref 80.0–100.0)
Monocytes Absolute: 0.4 10*3/uL (ref 0.1–1.0)
Monocytes Relative: 5 %
Neutro Abs: 5.6 10*3/uL (ref 1.7–7.7)
Neutrophils Relative %: 78 %
Platelets: 193 10*3/uL (ref 150–400)
RBC: 4.66 MIL/uL (ref 3.87–5.11)
RDW: 13.7 % (ref 11.5–15.5)
WBC: 7.2 10*3/uL (ref 4.0–10.5)
nRBC: 0 % (ref 0.0–0.2)

## 2020-04-29 LAB — COMPREHENSIVE METABOLIC PANEL
ALT: 41 U/L (ref 0–44)
AST: 93 U/L — ABNORMAL HIGH (ref 15–41)
Albumin: 2.5 g/dL — ABNORMAL LOW (ref 3.5–5.0)
Alkaline Phosphatase: 48 U/L (ref 38–126)
Anion gap: 12 (ref 5–15)
BUN: 29 mg/dL — ABNORMAL HIGH (ref 6–20)
CO2: 23 mmol/L (ref 22–32)
Calcium: 8 mg/dL — ABNORMAL LOW (ref 8.9–10.3)
Chloride: 104 mmol/L (ref 98–111)
Creatinine, Ser: 0.93 mg/dL (ref 0.44–1.00)
GFR calc Af Amer: >60 mL/min (ref >60)
GFR calc non Af Amer: >60 mL/min (ref >60)
Glucose, Bld: 153 mg/dL — ABNORMAL HIGH (ref 70–99)
Potassium: 3.9 mmol/L (ref 3.5–5.1)
Sodium: 139 mmol/L (ref 135–145)
Total Bilirubin: 0.4 mg/dL (ref 0.3–1.2)
Total Protein: 7.2 g/dL (ref 6.5–8.1)

## 2020-04-29 LAB — C-REACTIVE PROTEIN: CRP: 17.7 mg/dL — ABNORMAL HIGH (ref <1.0)

## 2020-04-29 LAB — D-DIMER, QUANTITATIVE: D-Dimer, Quant: 2.25 ug/mL-FEU — ABNORMAL HIGH (ref 0.00–0.50)

## 2020-04-29 LAB — MAGNESIUM: Magnesium: 2.2 mg/dL (ref 1.7–2.4)

## 2020-04-29 LAB — PHOSPHORUS: Phosphorus: 2.6 mg/dL (ref 2.5–4.6)

## 2020-04-29 MED ORDER — CHLORHEXIDINE GLUCONATE CLOTH 2 % EX PADS
6.0000 | MEDICATED_PAD | Freq: Every day | CUTANEOUS | Status: DC
  Administered 2020-04-30 – 2020-05-15 (×16): 6 via TOPICAL

## 2020-04-29 MED ORDER — FUROSEMIDE 10 MG/ML IJ SOLN
40.0000 mg | Freq: Once | INTRAMUSCULAR | Status: DC
  Filled 2020-04-29: qty 4

## 2020-04-29 NOTE — Progress Notes (Signed)
Patient has increased respiratory rate and decreased O2 sats in the 70's.  Pt placed on BIPAP 12/5 100% and tolerating well at this time.  States it is helping her breath easier.  O2 sats improved in to the mid to high 80's.  87% at this time.  RT will continue to monitor.

## 2020-04-29 NOTE — ED Notes (Signed)
Patient denies pain, uncomfortable in bed and repositioned frequently. No further complaints at this time. Alert, verbal, oriented x4 with no acute distress.

## 2020-04-29 NOTE — Progress Notes (Signed)
Spoke with patient about using Bipap through ventilator later tonight.  Patient stated that she gets claustrophobic using it.  I told patient it would help her if she would be willing to try it.  I explained for patient to wait till she was tired and let RN know and I would come back and place her on the machine to see if that helped more.  Will make RN aware of my conversation.

## 2020-04-29 NOTE — Progress Notes (Signed)
Patient continues to desat on heated HFNC in the 70s. Respiratory notified - Dr. Kerry Hough reports bipap not a good option but can try. Patient refused bipap and also intubation. Reports "if it is my time, it's my time." Dr. Kerry Hough notified.

## 2020-04-29 NOTE — ED Notes (Signed)
Provider at bedside

## 2020-04-29 NOTE — Progress Notes (Signed)
RT attempted x1 to obtain ABG. Patient asked RT to stop attempt and at this time is refusing another attempt. RN aware.

## 2020-04-29 NOTE — Progress Notes (Signed)
Pt placed on 15L HFNC and 100% NRB due to O2 sats in the low 80's Pt tolerating at this time O2 sats now 88.  RT will continue to monitor.

## 2020-04-29 NOTE — Progress Notes (Signed)
PROGRESS NOTE  Lisa Diaz IRC:[id number] DOB: [birth year] DOA: May 17, 2020 PCP: Patient, No Pcp Per  Brief History:  59 year old female with no documented chronic medical problems presenting with 1 week history of nonproductive cough, poor oral intake, shortness of breath, myalgias, and headache with associated loose stools.  The patient states that in the past 2 days her coughing and shortness of breath and generalized weakness had worsened.  In addition, she has lost her sense of smell.  The patient has been complaining of loose stools for the past 3 days.  She has had 3-4 loose bowel movements daily.  She denies any hematochezia but complains of some black stool.  She states that she has been taking Pepto-Bismol.  The patient has been having some subjective fevers and chills.  She has not been vaccinated against COVID-19.  She lives with her foster daughter who had some type of viral infection 2 weeks prior to the patient's admission.  The patient went to urgent care on 05/26/2020.  She was noted to have oxygen saturation in the 60s.  She was sent to emergency department for further evaluation. In the emergency department, the patient had temperature up to 102.9 F.  She was hemodynamically stable.  Oxygen saturation was 88-90% on a nonrebreather.  The patient was started on Solu-Medrol and remdesivir.  Assessment/Plan: Acute respiratory failure secondary to COVID-19 pneumonia. -Continue IV Solu-Medrol and remdesivir (day 3 of 5) -Vitamin C and zinc -Encourage prone position -Incentive spirometry -continue baricitinib day 2 -CRP 22.1>17.7 -Ferritin 1457>3,063 -D-dimer 1.70>2.25 -PCT 0.27 -Personally reviewed chest x-ray--bilateral patchy infiltrates -Personally reviewed EKG--sinus rhythm, no ST-T wave changes -currently requiring heated high flow oxygen and still having episodes of hypoxia -checking abg to evaluate pO2 -Tried bipap earlier today, but patient was having  difficulty keeping mask on -Discussed possibility of intubation and patient refused -Reviewed case with Dr. Craige Cotta and he agreed with current treatment.  -Since patient is DNR, there does not appear to be any further escalation of care to offer -can consider Bipap if patient's shortness of breath worsens, although this may be of limited benefit - will give one dose of lasix -At present, she does not appear to be in distress and is mentating appropriately -continue current treatments.  Renal insufficiency, type unspecified -No prior values to compare -Presented with serum creatinine 1.36 -creatinine improved to 0.9 with hydration  Diarrhea -improved -stool studies not sent since she has not had further diarrhea  Morbid obesity -BMI 53.22 -At risk for increased morbidity and mortality from COVID-19 -lifestyle modification  Hyponatremia -resolved with IV fluids.  Goals of care -discussed goals of care in detail with patient -On multiple occasions, she affirmed that she would not want to receive CPR or to be placed on a ventilator -She understands that if her respiratory condition continues to deteriorate then she may not survive this illness -She also correctly states that even with a ventilator, her recovery would not be certain -If she continues to worsen, then " when it is my time, it is my time" -If the situation were to arise, she wished to focus on comfort -She has identified her sister [name] as main point of contact -I updated her sister regarding the severity of patient's condition -[name] initially asked if she could reverse the patient's DNR since she wishes for patient to be full code -I informed [name] that ethically, since the patient has repeatedly told me that she would not  want to receive CPR/intubation and appears to understand the consequences of that decision, that I could not reverse CODE STATUS at this time. [name]-[name] understood nurses to continue current  treatments. -We will request palliative consult  Status is: Inpatient  Remains inpatient appropriate because:IV treatments appropriate due to intensity of illness or inability to take PO   Dispo: The patient is from: Home              Anticipated d/c is to: Home              Anticipated d/c date is: > 3 days              Patient currently is not medically stable to d/c.   Family Communication:   Updated patient's sister, 9/15  Consultants:  none  Code Status: DNR, confirmed with patient  DVT Prophylaxis:   [place] Lovenox   Procedures: As Listed in Progress Note Above  Antibiotics: None   Subjective: Patient is requiring increasing amounts of oxygen today.  She was on high flow nasal cannula, was briefly on BiPAP but did not tolerate that.  Subsequently transitioned to heated high flow.  She has been lying on her side.  She says that she feels better as compared to admission  Objective: Vitals:   04/29/20 1645 04/29/20 1700 04/29/20 1715 04/29/20 1730  BP:  (!) 128/102    Pulse: 90 86 90 85  Resp: (!) 32 20 (!) 32 (!) 35  Temp:      TempSrc:      SpO2: (!) 73% (!) 86% (!) 76% (!) 80%  Weight:      Height:        Intake/Output Summary (Last 24 hours) at 04/29/2020 1815 Last data filed at 04/28/2020 2305 Gross per 24 hour  Intake 212.7 ml  Output 500 ml  Net -287.3 ml   Weight change:  Exam:  General exam: Alert, awake, oriented x 3 Respiratory system: Clear to auscultation. Respiratory effort normal. Cardiovascular system:RRR. No murmurs, rubs, gallops. Gastrointestinal system: Abdomen is nondistended, soft and nontender. No organomegaly or masses felt. Normal bowel sounds heard. Central nervous system: Alert and oriented. No focal neurological deficits. Extremities: No C/C/E, +pedal pulses Skin: No rashes, lesions or ulcers  Psychiatry: Judgement and insight appear normal. Mood & affect appropriate.    Data Reviewed: I have personally reviewed following  labs and imaging studies Basic Metabolic Panel: Recent Labs  Lab 04/26/2020 1920 04/28/20 0851 04/29/20 0501  NA 134* 135 139  K 3.8 4.1 3.9  CL 99 103 104  CO2 24 22 23   GLUCOSE 125* 153* 153*  BUN 25* 24* 29*  CREATININE 1.36* 0.99 0.93  CALCIUM 7.9* 7.7* 8.0*  MG  --  2.0 2.2  PHOS  --  2.6 2.6   Liver Function Tests: Recent Labs  Lab 05/09/2020 1920 04/28/20 0851 04/29/20 0501  AST 67* 81* 93*  ALT 28 32 41  ALKPHOS 43 41 48  BILITOT 0.4 0.6 0.4  PROT 7.6 7.2 7.2  ALBUMIN 2.8* 2.6* 2.5*   No results for input(s): LIPASE, AMYLASE in the last 168 hours. No results for input(s): AMMONIA in the last 168 hours. Coagulation Profile: No results for input(s): INR, PROTIME in the last 168 hours. CBC: Recent Labs  Lab 04/16/2020 1920 04/28/20 0851 04/29/20 0501  WBC 6.1 6.4 7.2  NEUTROABS 4.9 5.5 5.6  HGB 12.9 13.3 13.4  HCT 40.7 42.1 42.6  MCV 92.1 91.9 91.4  PLT 155 148*  193   Cardiac Enzymes: No results for input(s): CKTOTAL, CKMB, CKMBINDEX, TROPONINI in the last 168 hours. BNP: Invalid input(s): POCBNP CBG: No results for input(s): GLUCAP in the last 168 hours. HbA1C: No results for input(s): HGBA1C in the last 72 hours. Urine analysis: No results found for: COLORURINE, APPEARANCEUR, LABSPEC, PHURINE, GLUCOSEU, HGBUR, BILIRUBINUR, KETONESUR, PROTEINUR, UROBILINOGEN, NITRITE, LEUKOCYTESUR Sepsis Labs: @LABRCNTIP (procalcitonin:4,lacticidven:4) ) Recent Results (from the past 240 hour(s))  SARS Coronavirus 2 by RT PCR (hospital order, performed in [facility] hospital lab) Nasopharyngeal Nasopharyngeal Swab     Status: Abnormal   Collection Time: 2020/05/23  7:20 PM   Specimen: Nasopharyngeal Swab  Result Value Ref Range Status   SARS Coronavirus 2 POSITIVE (A) NEGATIVE Final    Comment: RESULT CALLED TO, READ BACK BY AND VERIFIED WITH: T WALKER,RN@2149  23-May-2020 MKELLY (NOTE) SARS-CoV-2 target nucleic acids are DETECTED  SARS-CoV-2 RNA is generally  detectable in upper respiratory specimens  during the acute phase of infection.  Positive results are indicative  of the presence of the identified virus, but do not rule out bacterial infection or co-infection with other pathogens not detected by the test.  Clinical correlation with patient history and  other diagnostic information is necessary to determine patient infection status.  The expected result is negative.  Fact Sheet for Patients:   04/29/20   Fact Sheet for Healthcare Providers:   BoilerBrush.com.cy    This test is not yet approved or cleared by the https://pope.com/ FDA and  has been authorized for detection and/or diagnosis of SARS-CoV-2 by FDA under an Emergency Use Authorization (EUA).  This EUA will remain in effect (meaning this test  can be used) for the duration of  the COVID-19 declaration under Section 564(b)(1) of the Act, 21 U.S.C. section 360-bbb-3(b)(1), unless the authorization is terminated or revoked sooner.  Performed at [facility], [address] [place], [place]   Blood Culture (routine x 2)     Status: None (Preliminary result)   Collection Time: 05/23/20  7:48 PM   Specimen: BLOOD  Result Value Ref Range Status   Specimen Description BLOOD RIGHT ANTECUBITAL  Final   Special Requests   Final    BOTTLES DRAWN AEROBIC AND ANAEROBIC Blood Culture adequate volume   Culture   Final    NO GROWTH 2 DAYS Performed at [facility], [address] [place], [place]    Report Status PENDING  Incomplete  Blood Culture (routine x 2)     Status: None (Preliminary result)   Collection Time: 2020-05-23  8:37 PM   Specimen: BLOOD LEFT HAND  Result Value Ref Range Status   Specimen Description BLOOD LEFT HAND  Final   Special Requests   Final    BOTTLES DRAWN AEROBIC AND ANAEROBIC Blood Culture adequate volume   Culture   Final    NO GROWTH 2 DAYS Performed at [facility], [address] [place], [place]    Report Status PENDING  Incomplete     Scheduled Meds: . albuterol  2 puff Inhalation Q6H  . vitamin C  500 mg Oral Daily  . baricitinib  4 mg Oral Daily  . dextromethorphan-guaiFENesin  1 tablet Oral BID  . enoxaparin (LOVENOX) injection  80 mg Subcutaneous Q24H  . furosemide  40 mg Intravenous Once  . zinc sulfate  220 mg Oral Daily   Continuous Infusions: . levofloxacin (LEVAQUIN) IV Stopped (04/29/20 05/01/20)  . methylPREDNISolone (SOLU-MEDROL) injection 160 mg (04/29/20 1113)  .  remdesivir 100 mg in NS 100 mL 100 mg (04/29/20 1210)    Procedures/Studies: DG Chest Port 1 View  Result Date: 04/28/2020 CLINICAL DATA:  Shortness of breath. EXAM: PORTABLE CHEST 1 VIEW COMPARISON:  None. FINDINGS: Mild cardiomegaly is noted. No pneumothorax is noted. Multiple small ill-defined opacities are noted throughout both lungs concerning for possible multifocal pneumonia. Left pleural effusion cannot be excluded. Bony thorax is unremarkable. IMPRESSION: Multiple small ill-defined opacities are noted throughout both lungs concerning for possible multifocal pneumonia. Left pleural effusion cannot be excluded. Electronically Signed   By: Lupita Raider M.D.   On: 04/22/2020 19:42    Erick Blinks, MD  [facility]  If 7PM-7AM, please contact night-coverage [email or web address]  04/29/2020, 6:15 PM   LOS: 2 days

## 2020-04-30 DIAGNOSIS — Z515 Encounter for palliative care: Secondary | ICD-10-CM

## 2020-04-30 DIAGNOSIS — R6510 Systemic inflammatory response syndrome (SIRS) of non-infectious origin without acute organ dysfunction: Secondary | ICD-10-CM

## 2020-04-30 DIAGNOSIS — Z7189 Other specified counseling: Secondary | ICD-10-CM

## 2020-04-30 LAB — COMPREHENSIVE METABOLIC PANEL
ALT: 38 U/L (ref 0–44)
AST: 73 U/L — ABNORMAL HIGH (ref 15–41)
Albumin: 2.5 g/dL — ABNORMAL LOW (ref 3.5–5.0)
Alkaline Phosphatase: 78 U/L (ref 38–126)
Anion gap: 11 (ref 5–15)
BUN: 34 mg/dL — ABNORMAL HIGH (ref 6–20)
CO2: 26 mmol/L (ref 22–32)
Calcium: 8.1 mg/dL — ABNORMAL LOW (ref 8.9–10.3)
Chloride: 104 mmol/L (ref 98–111)
Creatinine, Ser: 0.95 mg/dL (ref 0.44–1.00)
GFR calc Af Amer: >60 mL/min (ref >60)
GFR calc non Af Amer: >60 mL/min (ref >60)
Glucose, Bld: 158 mg/dL — ABNORMAL HIGH (ref 70–99)
Potassium: 4.1 mmol/L (ref 3.5–5.1)
Sodium: 141 mmol/L (ref 135–145)
Total Bilirubin: 0.6 mg/dL (ref 0.3–1.2)
Total Protein: 7.4 g/dL (ref 6.5–8.1)

## 2020-04-30 LAB — MAGNESIUM: Magnesium: 2.3 mg/dL (ref 1.7–2.4)

## 2020-04-30 LAB — CBC WITH DIFFERENTIAL/PLATELET
Abs Immature Granulocytes: 0.11 10*3/uL — ABNORMAL HIGH (ref 0.00–0.07)
Basophils Absolute: 0 10*3/uL (ref 0.0–0.1)
Basophils Relative: 0 %
Eosinophils Absolute: 0 10*3/uL (ref 0.0–0.5)
Eosinophils Relative: 0 %
HCT: 43.3 % (ref 36.0–46.0)
Hemoglobin: 13.7 g/dL (ref 12.0–15.0)
Immature Granulocytes: 1 %
Lymphocytes Relative: 8 %
Lymphs Abs: 1 10*3/uL (ref 0.7–4.0)
MCH: 28.8 pg (ref 26.0–34.0)
MCHC: 31.6 g/dL (ref 30.0–36.0)
MCV: 91.2 fL (ref 80.0–100.0)
Monocytes Absolute: 0.7 10*3/uL (ref 0.1–1.0)
Monocytes Relative: 6 %
Neutro Abs: 10 10*3/uL — ABNORMAL HIGH (ref 1.7–7.7)
Neutrophils Relative %: 85 %
Platelets: 260 10*3/uL (ref 150–400)
RBC: 4.75 MIL/uL (ref 3.87–5.11)
RDW: 13.4 % (ref 11.5–15.5)
WBC: 11.8 10*3/uL — ABNORMAL HIGH (ref 4.0–10.5)
nRBC: 0.2 % (ref 0.0–0.2)

## 2020-04-30 LAB — PHOSPHORUS: Phosphorus: 2.5 mg/dL (ref 2.5–4.6)

## 2020-04-30 LAB — D-DIMER, QUANTITATIVE: D-Dimer, Quant: 4.32 ug/mL-FEU — ABNORMAL HIGH (ref 0.00–0.50)

## 2020-04-30 LAB — FERRITIN: Ferritin: 3160 ng/mL — ABNORMAL HIGH (ref 11–307)

## 2020-04-30 LAB — C-REACTIVE PROTEIN: CRP: 11 mg/dL — ABNORMAL HIGH (ref <1.0)

## 2020-04-30 LAB — MRSA PCR SCREENING: MRSA by PCR: NEGATIVE

## 2020-04-30 MED ORDER — HYDROXYZINE HCL 25 MG PO TABS
25.0000 mg | ORAL_TABLET | Freq: Three times a day (TID) | ORAL | Status: DC | PRN
  Administered 2020-04-30 – 2020-05-14 (×12): 25 mg via ORAL
  Filled 2020-04-30 (×12): qty 1

## 2020-04-30 MED ORDER — FUROSEMIDE 10 MG/ML IJ SOLN
40.0000 mg | Freq: Once | INTRAMUSCULAR | Status: AC
  Administered 2020-04-30: 40 mg via INTRAVENOUS
  Filled 2020-04-30: qty 4

## 2020-04-30 NOTE — Consult Note (Signed)
Consultation Note Date: 04/30/2020   Patient Name: Lisa Diaz  DOB: 01/19/1961  MRN: [id number]  Age / Sex: 59 y.o., female  PCP: Patient, No Pcp Per Referring Physician: Kathie Dike, MD  Reason for Consultation: Establishing goals of care  HPI/Patient Profile: 59 y.o. female  with past medical history of obesity and arthritis admitted on 05/11/2020 with one week history of nonproductive cough, poor oral intake, shortness of breath, myalgias, loose stools and headache. Unvaccinated against COVID-19. Went to urgent care on 9/13 and found to have oxygen saturations in the 60's, sent to ED. Hospital admission for acute respiratory failure secondary to COVID-19 pneumonia. Patient has expressed her wishes for DNR/DNI to multiple members of the care team including Dr. Roderic Diaz. Receiving HFNC and NRB. Aggressive medical management. Palliative medicine consultation for goals of care.   Clinical Assessment and Goals of Care:  I have reviewed medical records, discussed with care team, and met with Lisa Diaz at bedside to discuss goals of care. Lisa Diaz is awake, alert, oriented and able to participate in discussion.   I introduced Palliative Medicine as specialized medical care for people living with serious illness. It focuses on providing relief from the symptoms and stress of a serious illness. The goal is to improve quality of life for both the patient and the family.  We discussed a brief life review of the patient. Not married. One foster daughter who recently graduated from high school. Prior to admission, independent and able to care for herself.  Discussed events leading up to admission and course of hospitalization including diagnoses, interventions, plan of care, guarded prognosis.   I attempted to elicit values and goals of care important to the patient. Patient confirms her decision against life support  or resuscitation if this was necessary. She has not tolerated BiPAP because of anxiety. She states "when it's your time to go, it's your time to go." She speaks of her strong [place] faith and that this is the time to "put your faith to work" and "God's got the final say." She speaks of not wanting a prolonged recovery or to be a vegetable. Lisa Diaz shares that her mother was on life support machine and the hardest part was taking her off of the ventilator. She does not wish for this.   Lisa Diaz states "I don't have a death wish" and "I want to live" but again very clear that God will pull her through her condition if it is meant for her to survive. She is trying to stay positive and pray. Spiritual support provided.   I strongly encouraged Lisa Diaz to discuss her wishes with her sister, Lisa Diaz who is her POA if she was unable to make decisions. Shared that her sister wanted Dr. Ulice Diaz to reverse code status to full code and for everything to be done. Lisa Diaz stands firm on her decision for DNR/DNI and plans to call her sister around 5:30 this evening. Lisa Diaz also gives me permission to call her sister and update her on condition and her wishes.  Answered questions and concerns. Therapeutic listening and emotional/spiritual support provided.   **Shortly after, spoke with sister Lisa Diaz) via telephone. Updated her on diagnoses, interventions, plan of care, guarded prognosis. Updated her on my conversation with Lisa Diaz (and that many other staff have had this same conversation) for wishes for DNR/DNI. Lisa Diaz does not agree with her sister's decision and plans to further discuss this with her tonight. I encouraged they discuss and she honor her sisters wishes for DNR/DNI. Lisa Diaz also speaks of staying positive and that Lisa Diaz has many people praying for her recovery.     SUMMARY OF RECOMMENDATIONS    Patient stands firm on her decision for DNR/DNI code status. Her sister does not agree with this  decision. Patient cognizant and very clear on this decision. We will continue to honor her wishes for DNR/DNI.  Otherwise full scope treatment. Continue current plan of care and medical management. Patient is trying to stay hopeful and positive that God will help heal her and he has the final say. Watchful waiting. Time for outcomes.  [facility] PM[facility] provider off service until Monday 9/20 but plans to f/u next week if still hospitalized.   Code Status/Advance Care Planning:  DNR  Symptom Management:   Hydroxyzine 42m PO TID prn anxiety  Palliative Prophylaxis:   Aspiration, Delirium Protocol, Frequent Pain Assessment, Oral Care and Turn Reposition  Psycho-social/Spiritual:   Desire for further Chaplaincy support: yes  Additional Recommendations: Caregiving  Support/Resources and Compassionate Wean Education  Prognosis:   Guarded  Discharge Planning: To Be Determined      Primary Diagnoses: Present on Admission: . Pneumonia due to COVID-19 virus . Acute respiratory failure due to COVID-19 (HParkin . DNR (do not resuscitate)   I have reviewed the medical record, interviewed the patient and family, and examined the patient. The following aspects are pertinent.  Past Medical History:  Diagnosis Date  . Arthritis    Social History   Socioeconomic History  . Marital status: Divorced    Spouse name: Not on file  . Number of children: Not on file  . Years of education: Not on file  . Highest education level: Not on file  Occupational History  . Not on file  Tobacco Use  . Smoking status: Never Smoker  . Smokeless tobacco: Never Used  Substance and Sexual Activity  . Alcohol use: Never  . Drug use: Never  . Sexual activity: Not on file  Other Topics Concern  . Not on file  Social History Narrative  . Not on file   Social Determinants of Health   Financial Resource Strain:   . Difficulty of Paying Living Expenses: Not on file  Food Insecurity:   . Worried About  RCharity fundraiserin the Last Year: Not on file  . Ran Out of Food in the Last Year: Not on file  Transportation Needs:   . Lack of Transportation (Medical): Not on file  . Lack of Transportation (Non-Medical): Not on file  Physical Activity:   . Days of Exercise per Week: Not on file  . Minutes of Exercise per Session: Not on file  Stress:   . Feeling of Stress : Not on file  Social Connections:   . Frequency of Communication with Friends and Family: Not on file  . Frequency of Social Gatherings with Friends and Family: Not on file  . Attends Religious Services: Not on file  . Active Member of Clubs or Organizations: Not on file  . Attends Club or  Organization Meetings: Not on file  . Marital Status: Not on file   No family history on file. Scheduled Meds: . albuterol  2 puff Inhalation Q6H  . vitamin C  500 mg Oral Daily  . baricitinib  4 mg Oral Daily  . Chlorhexidine Gluconate Cloth  6 each Topical Daily  . dextromethorphan-guaiFENesin  1 tablet Oral BID  . enoxaparin (LOVENOX) injection  80 mg Subcutaneous Q24H  . furosemide  40 mg Intravenous Once  . zinc sulfate  220 mg Oral Daily   Continuous Infusions: . levofloxacin (LEVAQUIN) IV 750 mg (04/29/20 2314)  . methylPREDNISolone (SOLU-MEDROL) injection 160 mg (04/30/20 0905)  . remdesivir 100 mg in NS 100 mL 100 mg (04/30/20 0901)   PRN Meds:.acetaminophen, chlorpheniramine-HYDROcodone, guaiFENesin-dextromethorphan, hydrOXYzine, ondansetron (ZOFRAN) IV Medications Prior to Admission:  Prior to Admission medications   Medication Sig Start Date End Date Taking? Authorizing Provider  acetaminophen (TYLENOL) 500 MG tablet Take 1,000 mg by mouth every 6 (six) hours as needed for mild pain or fever.   Yes [provider]  ibuprofen (ADVIL) 200 MG tablet Take 800 mg by mouth every 6 (six) hours as needed for fever or moderate pain.   Yes [provider]  naproxen sodium (ALEVE) 220 MG tablet Take 220 mg by  mouth daily as needed (pain/fever).   Yes [provider]   Allergies  Allergen Reactions  . Penicillins     Did it involve swelling of the face/tongue/throat, SOB, or low BP? yes Did it involve sudden or severe rash/hives, skin peeling, or any reaction on the inside of your mouth or nose? yes Did you need to seek medical attention at a hospital or doctor's office? no When did it last happen?"years ago" If all above answers are "NO", may proceed with cephalosporin use.    Review of Systems  Constitutional: Positive for activity change.  Respiratory: Positive for shortness of breath.    Physical Exam Vitals and nursing note reviewed.  Constitutional:      General: She is awake.     Appearance: She is ill-appearing.  Cardiovascular:     Rate and Rhythm: Normal rate.  Pulmonary:     Effort: No tachypnea, accessory muscle usage or respiratory distress.     Comments: HFNC and NRB mask Skin:    General: Skin is warm and dry.  Neurological:     Mental Status: She is alert and oriented to person, place, and time.  Psychiatric:        Mood and Affect: Mood normal.        Speech: Speech normal.        Behavior: Behavior normal.        Cognition and Memory: Cognition normal.    Vital Signs: BP (!) 159/110   Pulse 93   Temp (!) 97.2 F (36.2 C) (Axillary)   Resp 19   Ht 5' 8" (1.727 m)   Wt (!) 189 kg   SpO2 91%   BMI 63.35 kg/m  Pain Scale: 0-10   Pain Score: 0-No pain   SpO2: SpO2: 91 % O2 Device:SpO2: 91 % O2 Flow Rate: .O2 Flow Rate (L/min): 40 L/min  IO: Intake/output summary: No intake or output data in the 24 hours ending 04/30/20 1600  LBM:   Baseline Weight: Weight: (!) 158.8 kg Most recent weight: Weight: (!) 189 kg     Palliative Assessment/Data: PPS 40%   Flowsheet Rows     Most Recent Value  Intake Tab  Referral  Department Hospitalist  Unit at Time of Referral ICU  Palliative Care Primary Diagnosis Pulmonary  Palliative Care Type  New Palliative care  Reason for referral Clarify Goals of Care  Date first seen by Palliative Care 04/30/20  Clinical Assessment  Palliative Performance Scale Score 40%  Psychosocial & Spiritual Assessment  Palliative Care Outcomes  Patient/Family meeting held? Yes  Who was at the meeting? patient, spoke with sister via phone  Palliative Care Outcomes Clarified goals of care, Provided psychosocial or spiritual support, Improved non-pain symptom therapy, ACP counseling assistance      Time In: 1420 Time Out: 1530 Time Total:70 Greater than 50%  of this time was spent counseling and coordinating care related to the above assessment and plan.  Signed by:  Ihor Dow, DNP, FNP-C Palliative Medicine Team  Phone: [phone] Fax: [phone]   Please contact Palliative Medicine Team phone at [phone] for questions and concerns.  For individual provider: See Shea Evans

## 2020-04-30 NOTE — Progress Notes (Signed)
Pt was asked in front of this RN and 2 others that if she was to become unresponsive due to her low oxygen levels would she want to be intubated which was explained in full detail she stated no she does not want that. Pt was then asked if her heart was to stop due to respiratory failure would she want chest compressions pt stated no she does not want that. Pt is alert and oriented X4. Pt is refusing to wear BP cuff even though it was explained to her why we needed to monitor her vital signs closely due to her status. Pt stated "I just want to rest".  Also refusing to wear gown. Pt educated about covid disease and process pt stated understanding.

## 2020-04-30 NOTE — Progress Notes (Signed)
PROGRESS NOTE  Lisa Diaz:[id number] DOB: 1960/12/10 DOA: 04/25/2020 PCP: Patient, No Pcp Per  Brief History:  59 year old female with no documented chronic medical problems presenting with 1 week history of nonproductive cough, poor oral intake, shortness of breath, myalgias, and headache with associated loose stools.  The patient states that in the past 2 days her coughing and shortness of breath and generalized weakness had worsened.  In addition, she has lost her sense of smell.  The patient has been complaining of loose stools for the past 3 days.  She has had 3-4 loose bowel movements daily.  She denies any hematochezia but complains of some black stool.  She states that she has been taking Pepto-Bismol.  The patient has been having some subjective fevers and chills.  She has not been vaccinated against COVID-19.  She lives with her foster daughter who had some type of viral infection 2 weeks prior to the patient's admission.  The patient went to urgent care on 04/22/2020.  She was noted to have oxygen saturation in the 60s.  She was sent to emergency department for further evaluation. In the emergency department, the patient had temperature up to 102.9 F.  She was hemodynamically stable.  Oxygen saturation was 88-90% on a nonrebreather.  The patient was started on Solu-Medrol and remdesivir.  Assessment/Plan: Acute respiratory failure secondary to COVID-19 pneumonia. -Continue IV Solu-Medrol and remdesivir (day 4 of 5) -Vitamin C and zinc -Encourage prone position -Incentive spirometry -continue baricitinib day 2 -CRP 22.1>17.7>11.0 -Ferritin 1457>3,063>3160 -D-dimer 1.70>2.25>4.32 -PCT 0.27 -Personally reviewed chest x-ray--bilateral patchy infiltrates -Personally reviewed EKG--sinus rhythm, no ST-T wave changes -currently requiring heated high flow oxygen and NRB mask -Tried bipap, but patient was having difficulty keeping mask on -Discussed possibility of  intubation and patient refused -Reviewed case with Dr. Craige Cotta and he agreed with current treatment.  -Since patient is DNR, there does not appear to be any further escalation of care to offer -can consider Bipap if patient's shortness of breath worsens, although this may be of limited benefit -will give one dose of lasix -At present, she does not appear to be in distress and is mentating appropriately -continue current treatments.  Renal insufficiency, type unspecified -No prior values to compare -Presented with serum creatinine 1.36 -creatinine improved to 0.9 with hydration  Diarrhea -improved -stool studies not sent since she has not had further diarrhea  Morbid obesity -BMI 53.22 -At risk for increased morbidity and mortality from COVID-19 -lifestyle modification  Hyponatremia -resolved with IV fluids.  Goals of care -discussed goals of care in detail with patient -On multiple occasions, she affirmed that she would not want to receive CPR or to be placed on a ventilator -She understands that if her respiratory condition continues to deteriorate then she may not survive this illness -She also correctly states that even with a ventilator, her recovery would not be certain -If she continues to worsen, then " when it is my time, it is my time" -If the situation were to arise, she wished to focus on comfort -She has identified her sister Lisa Diaz as main point of contact -I updated her sister regarding the severity of patient's condition -Lisa Diaz initially asked if she could reverse the patient's DNR since she wishes for patient to be full code -I informed Lisa Diaz that ethically, since the patient has repeatedly told me and multiple other staff members that she would not want to receive CPR/intubation and appears  to understand the consequences of that decision, that I could not reverse CODE STATUS at this time. Lisa Diaz understood has asked us to continue current  treatments. -palliative care following, continue current treatments  Status is: Inpatient  Remains inpatient appropriate because:IV treatments appropriate due to intensity of illness or inability to take PO   Dispo: The patient is from: Home              Anticipated d/c is to: Home              Anticipated d/c date is: > 3 days              Patient currently is not medically stable to d/c.   Family Communication:   Updated patient's sister, 9/16  Consultants:  none  Code Status: DNR, confirmed with patient  DVT Prophylaxis:   [place] Lovenox   Procedures: As Listed in Progress Note Above  Antibiotics: None   Subjective: Currently on HHFNC and NRB mask. She feels that her breathing may be marginally better today. She feels anxious and has had trouble sleeping  Objective: Vitals:   04/30/20 1000 04/30/20 1159 04/30/20 1405 04/30/20 1557  BP: (!) 159/110     Pulse:      Resp: (!) 27 19    Temp:  98.4 F (36.9 C)  (!) 97.2 F (36.2 C)  TempSrc:  Oral  Axillary  SpO2:   91%   Weight:      Height:        Intake/Output Summary (Last 24 hours) at 04/30/2020 1923 Last data filed at 04/30/2020 1854 Gross per 24 hour  Intake 510 ml  Output 500 ml  Net 10 ml   Weight change:  Exam:  General exam: Alert, awake, oriented x 3 Respiratory system: Clear to auscultation. Respiratory effort normal. Cardiovascular system:RRR. No murmurs, rubs, gallops. Gastrointestinal system: Abdomen is nondistended, soft and nontender. No organomegaly or masses felt. Normal bowel sounds heard. Central nervous system: Alert and oriented. No focal neurological deficits. Extremities: No C/C/E, +pedal pulses Skin: No rashes, lesions or ulcers  Psychiatry: Judgement and insight appear normal. Mood & affect appropriate.      Data Reviewed: I have personally reviewed following labs and imaging studies Basic Metabolic Panel: Recent Labs  Lab 05/02/2020 1920 04/28/20 0851 04/29/20 0501  04/30/20 0349  NA 134* 135 139 141  K 3.8 4.1 3.9 4.1  CL 99 103 104 104  CO2 24 22 23 26   GLUCOSE 125* 153* 153* 158*  BUN 25* 24* 29* 34*  CREATININE 1.36* 0.99 0.93 0.95  CALCIUM 7.9* 7.7* 8.0* 8.1*  MG  --  2.0 2.2 2.3  PHOS  --  2.6 2.6 2.5   Liver Function Tests: Recent Labs  Lab 04/17/2020 1920 04/28/20 0851 04/29/20 0501 04/30/20 0349  AST 67* 81* 93* 73*  ALT 28 32 41 38  ALKPHOS 43 41 48 78  BILITOT 0.4 0.6 0.4 0.6  PROT 7.6 7.2 7.2 7.4  ALBUMIN 2.8* 2.6* 2.5* 2.5*   No results for input(s): LIPASE, AMYLASE in the last 168 hours. No results for input(s): AMMONIA in the last 168 hours. Coagulation Profile: No results for input(s): INR, PROTIME in the last 168 hours. CBC: Recent Labs  Lab 04/24/2020 1920 04/28/20 0851 04/29/20 0501 04/30/20 0349  WBC 6.1 6.4 7.2 11.8*  NEUTROABS 4.9 5.5 5.6 10.0*  HGB 12.9 13.3 13.4 13.7  HCT 40.7 42.1 42.6 43.3  MCV 92.1 91.9 91.4 91.2  PLT 155 148*  193 260   Cardiac Enzymes: No results for input(s): CKTOTAL, CKMB, CKMBINDEX, TROPONINI in the last 168 hours. BNP: Invalid input(s): POCBNP CBG: No results for input(s): GLUCAP in the last 168 hours. HbA1C: No results for input(s): HGBA1C in the last 72 hours. Urine analysis: No results found for: COLORURINE, APPEARANCEUR, LABSPEC, PHURINE, GLUCOSEU, HGBUR, BILIRUBINUR, KETONESUR, PROTEINUR, UROBILINOGEN, NITRITE, LEUKOCYTESUR Sepsis Labs: @LABRCNTIP (procalcitonin:4,lacticidven:4) ) Recent Results (from the past 240 hour(s))  SARS Coronavirus 2 by RT PCR (hospital order, performed in [facility] hospital lab) Nasopharyngeal Nasopharyngeal Swab     Status: Abnormal   Collection Time: 05/03/2020  7:20 PM   Specimen: Nasopharyngeal Swab  Result Value Ref Range Status   SARS Coronavirus 2 POSITIVE (A) NEGATIVE Final    Comment: RESULT CALLED TO, READ BACK BY AND VERIFIED WITH: T WALKER,RN@2149  05/02/2020 MKELLY (NOTE) SARS-CoV-2 target nucleic acids are  DETECTED  SARS-CoV-2 RNA is generally detectable in upper respiratory specimens  during the acute phase of infection.  Positive results are indicative  of the presence of the identified virus, but do not rule out bacterial infection or co-infection with other pathogens not detected by the test.  Clinical correlation with patient history and  other diagnostic information is necessary to determine patient infection status.  The expected result is negative.  Fact Sheet for Patients:   04/29/20   Fact Sheet for Healthcare Providers:   BoilerBrush.com.cy    This test is not yet approved or cleared by the https://pope.com/ FDA and  has been authorized for detection and/or diagnosis of SARS-CoV-2 by FDA under an Emergency Use Authorization (EUA).  This EUA will remain in effect (meaning this test  can be used) for the duration of  the COVID-19 declaration under Section 564(b)(1) of the Act, 21 U.S.C. section 360-bbb-3(b)(1), unless the authorization is terminated or revoked sooner.  Performed at [facility], [address], [place], [place]   Blood Culture (routine x 2)     Status: None (Preliminary result)   Collection Time: 05/01/2020  7:48 PM   Specimen: BLOOD  Result Value Ref Range Status   Specimen Description BLOOD RIGHT ANTECUBITAL  Final   Special Requests   Final    BOTTLES DRAWN AEROBIC AND ANAEROBIC Blood Culture adequate volume   Culture   Final    NO GROWTH 2 DAYS Performed at [facility], [address] [place], [place]    Report Status PENDING  Incomplete  Blood Culture (routine x 2)     Status: None (Preliminary result)   Collection Time: 05/08/2020  8:37 PM   Specimen: BLOOD  Result Value Ref Range Status   Specimen Description BLOOD LEFT HAND  Final   Special Requests   Final    BOTTLES DRAWN AEROBIC AND ANAEROBIC Blood Culture adequate volume   Culture   Final    NO GROWTH 2 DAYS Performed  at [facility], [address], [place], [place]    Report Status PENDING  Incomplete  MRSA PCR Screening     Status: None   Collection Time: 04/29/20 10:51 PM   Specimen: Nasal Mucosa; Nasopharyngeal  Result Value Ref Range Status   MRSA by PCR NEGATIVE NEGATIVE Final    Comment:        The GeneXpert MRSA Assay (FDA approved for NASAL specimens only), is one component of a comprehensive MRSA colonization surveillance program. It is not intended to diagnose MRSA infection nor to guide or monitor treatment for MRSA infections. Performed at  [facility], [address], [place], [place] [address]      Scheduled Meds: . albuterol  2 puff Inhalation Q6H  . vitamin C  500 mg Oral Daily  . baricitinib  4 mg Oral Daily  . Chlorhexidine Gluconate Cloth  6 each Topical Daily  . dextromethorphan-guaiFENesin  1 tablet Oral BID  . enoxaparin (LOVENOX) injection  80 mg Subcutaneous Q24H  . furosemide  40 mg Intravenous Once  . zinc sulfate  220 mg Oral Daily   Continuous Infusions: . levofloxacin (LEVAQUIN) IV 750 mg (04/29/20 2314)  . methylPREDNISolone (SOLU-MEDROL) injection 160 mg (04/30/20 0905)  . remdesivir 100 mg in NS 100 mL 100 mg (04/30/20 0901)    Procedures/Studies: DG Chest Port 1 View  Result Date: 05-04-2020 CLINICAL DATA:  Shortness of breath. EXAM: PORTABLE CHEST 1 VIEW COMPARISON:  None. FINDINGS: Mild cardiomegaly is noted. No pneumothorax is noted. Multiple small ill-defined opacities are noted throughout both lungs concerning for possible multifocal pneumonia. Left pleural effusion cannot be excluded. Bony thorax is unremarkable. IMPRESSION: Multiple small ill-defined opacities are noted throughout both lungs concerning for possible multifocal pneumonia. Left pleural effusion cannot be excluded. Electronically Signed   By: Lupita Raider M.D.   On: 2020/05/04 19:42    Erick Blinks, MD  [facility]  If 7PM-7AM, please contact  night-coverage [email or web address]  04/30/2020, 7:23 PM   LOS: 3 days

## 2020-04-30 NOTE — Progress Notes (Signed)
Pt o2 sats have been in the 60's and 70's patient informed and educated. Pt refusing to wear bipap.

## 2020-04-30 NOTE — Progress Notes (Signed)
Pharmacy Antibiotic Note  Lisa Diaz is a 59 y.o. female admitted on 05/12/2020 with CAP. Pt also with COVID.  Pharmacy has been consulted for Levaquin dosing. Afebrile now. Resp fx secondary to COVID-PNA. Also, started on baricitnib. PCT 0.27. chest x-ray--bilateral patchy infiltrates. Plan: Continue Levaquin 750mg  IV q24h Will f/u micro data, pt's clinical condition, and renal function  Height: 5\' 8"  (172.7 cm) Weight: (!) 189 kg (416 lb 10.7 oz) IBW/kg (Calculated) : 63.9  Temp (24hrs), Avg:98.1 F (36.7 C), Min:97.9 F (36.6 C), Max:98.3 F (36.8 C)  Recent Labs  Lab 04/30/2020 1920 05/09/2020 1948 04/28/20 0851 04/29/20 0501 04/30/20 0349  WBC 6.1  --  6.4 7.2 11.8*  CREATININE 1.36*  --  0.99 0.93 0.95  LATICACIDVEN  --  1.7  --   --   --     Estimated Creatinine Clearance: 114.6 mL/min (by C-G formula based on SCr of 0.95 mg/dL).    Allergies  Allergen Reactions  . Penicillins     Did it involve swelling of the face/tongue/throat, SOB, or low BP? yes Did it involve sudden or severe rash/hives, skin peeling, or any reaction on the inside of your mouth or nose? yes Did you need to seek medical attention at a hospital or doctor's office? no When did it last happen?"years ago" If all above answers are "NO", may proceed with cephalosporin use.     Antimicrobials this admission:  Levaquin 9/13 >> Baricitinib 9/14 >> Microbiology results:  9/13 BCx: ngtd 9/13 SARS coronavirus 2: positiveCdiff PCR:  Thank you for allowing pharmacy to be a part of this patient's care.  10/13, BS Pharm D, 10/13 Clinical Pharmacist Pager [phone] 04/30/2020 10:48 AM

## 2020-05-01 LAB — CBC WITH DIFFERENTIAL/PLATELET
Basophils Absolute: 0 10*3/uL (ref 0.0–0.1)
Basophils Relative: 0 %
Eosinophils Absolute: 0 10*3/uL (ref 0.0–0.5)
Eosinophils Relative: 0 %
HCT: 44 % (ref 36.0–46.0)
Hemoglobin: 13.8 g/dL (ref 12.0–15.0)
Lymphocytes Relative: 8 %
Lymphs Abs: 1 10*3/uL (ref 0.7–4.0)
MCH: 28.8 pg (ref 26.0–34.0)
MCHC: 31.4 g/dL (ref 30.0–36.0)
MCV: 91.9 fL (ref 80.0–100.0)
Monocytes Absolute: 0.9 10*3/uL (ref 0.1–1.0)
Monocytes Relative: 7 %
Myelocytes: 2 %
Neutro Abs: 10.6 10*3/uL — ABNORMAL HIGH (ref 1.7–7.7)
Neutrophils Relative %: 83 %
Platelets: 254 10*3/uL (ref 150–400)
RBC: 4.79 MIL/uL (ref 3.87–5.11)
RDW: 13.9 % (ref 11.5–15.5)
WBC: 12.8 10*3/uL — ABNORMAL HIGH (ref 4.0–10.5)
nRBC: 0.4 % — ABNORMAL HIGH (ref 0.0–0.2)

## 2020-05-01 LAB — COMPREHENSIVE METABOLIC PANEL
ALT: 40 U/L (ref 0–44)
AST: 68 U/L — ABNORMAL HIGH (ref 15–41)
Albumin: 2.4 g/dL — ABNORMAL LOW (ref 3.5–5.0)
Alkaline Phosphatase: 102 U/L (ref 38–126)
Anion gap: 10 (ref 5–15)
BUN: 39 mg/dL — ABNORMAL HIGH (ref 6–20)
CO2: 26 mmol/L (ref 22–32)
Calcium: 8 mg/dL — ABNORMAL LOW (ref 8.9–10.3)
Chloride: 105 mmol/L (ref 98–111)
Creatinine, Ser: 1.05 mg/dL — ABNORMAL HIGH (ref 0.44–1.00)
GFR calc Af Amer: >60 mL/min (ref >60)
GFR calc non Af Amer: 58 mL/min — ABNORMAL LOW (ref >60)
Glucose, Bld: 149 mg/dL — ABNORMAL HIGH (ref 70–99)
Potassium: 4.1 mmol/L (ref 3.5–5.1)
Sodium: 141 mmol/L (ref 135–145)
Total Bilirubin: 0.6 mg/dL (ref 0.3–1.2)
Total Protein: 7.3 g/dL (ref 6.5–8.1)

## 2020-05-01 LAB — FERRITIN: Ferritin: 1710 ng/mL — ABNORMAL HIGH (ref 11–307)

## 2020-05-01 LAB — PHOSPHORUS: Phosphorus: 3.4 mg/dL (ref 2.5–4.6)

## 2020-05-01 LAB — C-REACTIVE PROTEIN: CRP: 12.1 mg/dL — ABNORMAL HIGH (ref <1.0)

## 2020-05-01 LAB — MAGNESIUM: Magnesium: 2.5 mg/dL — ABNORMAL HIGH (ref 1.7–2.4)

## 2020-05-01 LAB — D-DIMER, QUANTITATIVE: D-Dimer, Quant: >20 ug/mL-FEU — ABNORMAL HIGH (ref 0.00–0.50)

## 2020-05-01 MED ORDER — HYDRALAZINE HCL 20 MG/ML IJ SOLN
10.0000 mg | INTRAMUSCULAR | Status: DC | PRN
  Administered 2020-05-02 (×2): 10 mg via INTRAVENOUS
  Filled 2020-05-01 (×3): qty 1

## 2020-05-01 NOTE — Progress Notes (Signed)
PROGRESS NOTE  Lisa Diaz BTD:[id number] DOB: 08/25/1960 DOA: May 05, 2020 PCP: Patient, No Pcp Per  Brief History:  60 year old female with no documented chronic medical problems presenting with 1 week history of nonproductive cough, poor oral intake, shortness of breath, myalgias, and headache with associated loose stools.  The patient states that in the past 2 days her coughing and shortness of breath and generalized weakness had worsened.  In addition, she has lost her sense of smell.  The patient has been complaining of loose stools for the past 3 days.  She has had 3-4 loose bowel movements daily.  She denies any hematochezia but complains of some black stool.  She states that she has been taking Pepto-Bismol.  The patient has been having some subjective fevers and chills.  She has not been vaccinated against COVID-19.  She lives with her foster daughter who had some type of viral infection 2 weeks prior to the patient's admission.  The patient went to urgent care on May 05, 2020.  She was noted to have oxygen saturation in the 60s.  She was sent to emergency department for further evaluation. In the emergency department, the patient had temperature up to 102.9 F.  She was hemodynamically stable.  Oxygen saturation was 88-90% on a nonrebreather.  The patient was started on Solu-Medrol and remdesivir.  Assessment/Plan: Acute respiratory failure secondary to COVID-19 pneumonia. -Continue IV Solu-Medrol day 5 -She completed remdesivir on 9/17 -Vitamin C and zinc -Encourage prone position -Incentive spirometry -continue baricitinib day 3 -CRP 22.1>17.7>11.0 -Ferritin 1457>3,063>3160 -D-dimer 1.70>2.25>4.32 -PCT 0.27 -Personally reviewed chest x-ray--bilateral patchy infiltrates -Personally reviewed EKG--sinus rhythm, no ST-T wave changes -currently requiring heated high flow oxygen and NRB mask -Tried bipap, but patient was having difficulty keeping mask on -Discussed  possibility of intubation and patient refused -Reviewed case with Dr. Craige Cotta and he agreed with current treatment.  -Since patient is DNR, there does not appear to be any further escalation of care to offer -can consider Bipap if patient's shortness of breath worsens, although this may be of limited benefit  -At present, she does not appear to be in distress and is mentating appropriately -continue current treatments.  Renal insufficiency, type unspecified -No prior values to compare -Presented with serum creatinine 1.36 -creatinine improved to 0.9 with hydration  Diarrhea -improved -stool studies not sent since she has not had further diarrhea  Morbid obesity -BMI 53.22 -At risk for increased morbidity and mortality from COVID-19 -lifestyle modification  Hyponatremia -resolved with IV fluids.  Goals of care -discussed goals of care in detail with patient -On multiple occasions, she affirmed that she would not want to receive CPR or to be placed on a ventilator -She understands that if her respiratory condition continues to deteriorate then she may not survive this illness -She also correctly states that even with a ventilator, her recovery would not be certain -If she continues to worsen, then " when it is my time, it is my time" -If the situation were to arise, she wished to focus on comfort -She has identified her sister Lisa Diaz as main point of contact -I updated her sister regarding the severity of patient's condition -Lisa Diaz initially asked if she could reverse the patient's DNR since she wishes for patient to be full code -I informed Lisa Diaz that ethically, since the patient has repeatedly told me and multiple other staff members that she would not want to receive CPR/intubation and appears to understand the consequences  of that decision, that I could not reverse CODE STATUS at this time. Lisa Diaz understood has asked us to continue current treatments. -palliative care  following, continue current treatments  Status is: Inpatient  Remains inpatient appropriate because:IV treatments appropriate due to intensity of illness or inability to take PO   Dispo: The patient is from: Home              Anticipated d/c is to: Home              Anticipated d/c date is: > 3 days              Patient currently is not medically stable to d/c.   Family Communication:   Updated patient's sister, 9/17  Consultants:  none  Code Status: DNR, confirmed with patient  DVT Prophylaxis:   [place] Lovenox   Procedures: As Listed in Progress Note Above  Antibiotics: None   Subjective: Patient is feeling better today.  She continues to require very high amounts of oxygen with nonrebreather and heated high flow.  She has no cough.  Objective: Vitals:   05/01/20 1600 05/01/20 1648 05/01/20 1700 05/01/20 1800  BP: (!) 150/99  (!) 173/99 (!) 183/115  Pulse:      Resp: (!) 33  (!) 27 (!) 38  Temp:  98.6 F (37 C)    TempSrc:  Oral    SpO2:      Weight:      Height:        Intake/Output Summary (Last 24 hours) at 05/01/2020 1907 Last data filed at 05/01/2020 1300 Gross per 24 hour  Intake 979.15 ml  Output --  Net 979.15 ml   Weight change: 0 kg Exam:  General exam: Alert, awake, oriented x 3 Respiratory system: Clear to auscultation. Respiratory effort normal. Cardiovascular system:RRR. No murmurs, rubs, gallops. Gastrointestinal system: Abdomen is nondistended, soft and nontender. No organomegaly or masses felt. Normal bowel sounds heard. Central nervous system: Alert and oriented. No focal neurological deficits. Extremities: No C/C/E, +pedal pulses Skin: No rashes, lesions or ulcers Psychiatry: Judgement and insight appear normal. Mood & affect appropriate.    Data Reviewed: I have personally reviewed following labs and imaging studies Basic Metabolic Panel: Recent Labs  Lab 04/16/2020 1920 04/28/20 0851 04/29/20 0501 04/30/20 0349 05/01/20 0559   NA 134* 135 139 141 141  K 3.8 4.1 3.9 4.1 4.1  CL 99 103 104 104 105  CO2 24 22 23 26 26   GLUCOSE 125* 153* 153* 158* 149*  BUN 25* 24* 29* 34* 39*  CREATININE 1.36* 0.99 0.93 0.95 1.05*  CALCIUM 7.9* 7.7* 8.0* 8.1* 8.0*  MG  --  2.0 2.2 2.3 2.5*  PHOS  --  2.6 2.6 2.5 3.4   Liver Function Tests: Recent Labs  Lab 04/18/2020 1920 04/28/20 0851 04/29/20 0501 04/30/20 0349 05/01/20 0559  AST 67* 81* 93* 73* 68*  ALT 28 32 41 38 40  ALKPHOS 43 41 48 78 102  BILITOT 0.4 0.6 0.4 0.6 0.6  PROT 7.6 7.2 7.2 7.4 7.3  ALBUMIN 2.8* 2.6* 2.5* 2.5* 2.4*   No results for input(s): LIPASE, AMYLASE in the last 168 hours. No results for input(s): AMMONIA in the last 168 hours. Coagulation Profile: No results for input(s): INR, PROTIME in the last 168 hours. CBC: Recent Labs  Lab 04/30/2020 1920 04/28/20 0851 04/29/20 0501 04/30/20 0349 05/01/20 0559  WBC 6.1 6.4 7.2 11.8* 12.8*  NEUTROABS 4.9 5.5 5.6 10.0* 10.6*  HGB 12.9  13.3 13.4 13.7 13.8  HCT 40.7 42.1 42.6 43.3 44.0  MCV 92.1 91.9 91.4 91.2 91.9  PLT 155 148* 193 260 254   Cardiac Enzymes: No results for input(s): CKTOTAL, CKMB, CKMBINDEX, TROPONINI in the last 168 hours. BNP: Invalid input(s): POCBNP CBG: No results for input(s): GLUCAP in the last 168 hours. HbA1C: No results for input(s): HGBA1C in the last 72 hours. Urine analysis: No results found for: COLORURINE, APPEARANCEUR, LABSPEC, PHURINE, GLUCOSEU, HGBUR, BILIRUBINUR, KETONESUR, PROTEINUR, UROBILINOGEN, NITRITE, LEUKOCYTESUR Sepsis Labs: @LABRCNTIP (procalcitonin:4,lacticidven:4) ) Recent Results (from the past 240 hour(s))  SARS Coronavirus 2 by RT PCR (hospital order, performed in [facility] hospital lab) Nasopharyngeal Nasopharyngeal Swab     Status: Abnormal   Collection Time: 05-22-20  7:20 PM   Specimen: Nasopharyngeal Swab  Result Value Ref Range Status   SARS Coronavirus 2 POSITIVE (A) NEGATIVE Final    Comment: RESULT CALLED TO, READ BACK BY AND  VERIFIED WITH: T WALKER,RN@[id number]  May 22, 2020 MKELLY (NOTE) SARS-CoV-2 target nucleic acids are DETECTED  SARS-CoV-2 RNA is generally detectable in upper respiratory specimens  during the acute phase of infection.  Positive results are indicative  of the presence of the identified virus, but do not rule out bacterial infection or co-infection with other pathogens not detected by the test.  Clinical correlation with patient history and  other diagnostic information is necessary to determine patient infection status.  The expected result is negative.  Fact Sheet for Patients:   04/29/20   Fact Sheet for Healthcare Providers:   BoilerBrush.com.cy    This test is not yet approved or cleared by the https://pope.com/ FDA and  has been authorized for detection and/or diagnosis of SARS-CoV-2 by FDA under an Emergency Use Authorization (EUA).  This EUA will remain in effect (meaning this test  can be used) for the duration of  the COVID-19 declaration under Section 564(b)(1) of the Act, 21 U.S.C. section 360-bbb-3(b)(1), unless the authorization is terminated or revoked sooner.  Performed at [facility], [address], [place], [place]   Blood Culture (routine x 2)     Status: None (Preliminary result)   Collection Time: 05/22/20  7:48 PM   Specimen: BLOOD  Result Value Ref Range Status   Specimen Description BLOOD RIGHT ANTECUBITAL  Final   Special Requests   Final    BOTTLES DRAWN AEROBIC AND ANAEROBIC Blood Culture adequate volume   Culture   Final    NO GROWTH 4 DAYS Performed at [facility], [address], [place], [place]    Report Status PENDING  Incomplete  Blood Culture (routine x 2)     Status: None (Preliminary result)   Collection Time: 05/22/20  8:37 PM   Specimen: BLOOD LEFT HAND  Result Value Ref Range Status   Specimen Description BLOOD LEFT HAND  Final   Special Requests   Final    BOTTLES  DRAWN AEROBIC AND ANAEROBIC Blood Culture adequate volume   Culture   Final    NO GROWTH 4 DAYS Performed at [facility], [address], [place], [place]    Report Status PENDING  Incomplete  MRSA PCR Screening     Status: None   Collection Time: 04/29/20 10:51 PM   Specimen: Nasal Mucosa; Nasopharyngeal  Result Value Ref Range Status   MRSA by PCR NEGATIVE NEGATIVE Final    Comment:        The GeneXpert MRSA Assay (FDA approved for NASAL specimens only), is one component of  a comprehensive MRSA colonization surveillance program. It is not intended to diagnose MRSA infection nor to guide or monitor treatment for MRSA infections. Performed at [facility], [address], [place], [place] [address]      Scheduled Meds: . albuterol  2 puff Inhalation Q6H  . vitamin C  500 mg Oral Daily  . baricitinib  4 mg Oral Daily  . Chlorhexidine Gluconate Cloth  6 each Topical Daily  . dextromethorphan-guaiFENesin  1 tablet Oral BID  . enoxaparin (LOVENOX) injection  80 mg Subcutaneous Q24H  . zinc sulfate  220 mg Oral Daily   Continuous Infusions: . methylPREDNISolone (SOLU-MEDROL) injection 160 mg (05/01/20 0915)    Procedures/Studies: DG Chest Port 1 View  Result Date: 05/09/2020 CLINICAL DATA:  Shortness of breath. EXAM: PORTABLE CHEST 1 VIEW COMPARISON:  None. FINDINGS: Mild cardiomegaly is noted. No pneumothorax is noted. Multiple small ill-defined opacities are noted throughout both lungs concerning for possible multifocal pneumonia. Left pleural effusion cannot be excluded. Bony thorax is unremarkable. IMPRESSION: Multiple small ill-defined opacities are noted throughout both lungs concerning for possible multifocal pneumonia. Left pleural effusion cannot be excluded. Electronically Signed   By: Lupita Raider M.D.   On: 04/30/2020 19:42    Erick Blinks, MD  [facility]  If 7PM-7AM, please contact night-coverage [email or web address]  05/01/2020, 7:07 PM    LOS: 4 days

## 2020-05-02 LAB — CULTURE, BLOOD (ROUTINE X 2)
Culture: NO GROWTH
Culture: NO GROWTH
Special Requests: ADEQUATE
Special Requests: ADEQUATE

## 2020-05-02 LAB — CBC WITH DIFFERENTIAL/PLATELET
Abs Immature Granulocytes: 0.63 10*3/uL — ABNORMAL HIGH (ref 0.00–0.07)
Basophils Absolute: 0.1 10*3/uL (ref 0.0–0.1)
Basophils Relative: 0 %
Eosinophils Absolute: 0 10*3/uL (ref 0.0–0.5)
Eosinophils Relative: 0 %
HCT: 46.8 % — ABNORMAL HIGH (ref 36.0–46.0)
Hemoglobin: 14.6 g/dL (ref 12.0–15.0)
Immature Granulocytes: 4 %
Lymphocytes Relative: 9 %
Lymphs Abs: 1.6 10*3/uL (ref 0.7–4.0)
MCH: 28.9 pg (ref 26.0–34.0)
MCHC: 31.2 g/dL (ref 30.0–36.0)
MCV: 92.7 fL (ref 80.0–100.0)
Monocytes Absolute: 0.6 10*3/uL (ref 0.1–1.0)
Monocytes Relative: 4 %
Neutro Abs: 14.3 10*3/uL — ABNORMAL HIGH (ref 1.7–7.7)
Neutrophils Relative %: 83 %
Platelets: 219 10*3/uL (ref 150–400)
RBC: 5.05 MIL/uL (ref 3.87–5.11)
RDW: 13.9 % (ref 11.5–15.5)
WBC: 17.3 10*3/uL — ABNORMAL HIGH (ref 4.0–10.5)
nRBC: 0.3 % — ABNORMAL HIGH (ref 0.0–0.2)

## 2020-05-02 LAB — COMPREHENSIVE METABOLIC PANEL
ALT: 42 U/L (ref 0–44)
AST: 53 U/L — ABNORMAL HIGH (ref 15–41)
Albumin: 2.5 g/dL — ABNORMAL LOW (ref 3.5–5.0)
Alkaline Phosphatase: 135 U/L — ABNORMAL HIGH (ref 38–126)
Anion gap: 12 (ref 5–15)
BUN: 34 mg/dL — ABNORMAL HIGH (ref 6–20)
CO2: 26 mmol/L (ref 22–32)
Calcium: 8.2 mg/dL — ABNORMAL LOW (ref 8.9–10.3)
Chloride: 104 mmol/L (ref 98–111)
Creatinine, Ser: 0.94 mg/dL (ref 0.44–1.00)
GFR calc Af Amer: >60 mL/min (ref >60)
GFR calc non Af Amer: >60 mL/min (ref >60)
Glucose, Bld: 172 mg/dL — ABNORMAL HIGH (ref 70–99)
Potassium: 4.3 mmol/L (ref 3.5–5.1)
Sodium: 142 mmol/L (ref 135–145)
Total Bilirubin: 0.5 mg/dL (ref 0.3–1.2)
Total Protein: 7.7 g/dL (ref 6.5–8.1)

## 2020-05-02 LAB — FERRITIN: Ferritin: 1341 ng/mL — ABNORMAL HIGH (ref 11–307)

## 2020-05-02 LAB — D-DIMER, QUANTITATIVE: D-Dimer, Quant: >20 ug/mL-FEU — ABNORMAL HIGH (ref 0.00–0.50)

## 2020-05-02 LAB — C-REACTIVE PROTEIN: CRP: 16.1 mg/dL — ABNORMAL HIGH (ref <1.0)

## 2020-05-02 MED ORDER — ALBUTEROL SULFATE HFA 108 (90 BASE) MCG/ACT IN AERS
2.0000 | INHALATION_SPRAY | Freq: Four times a day (QID) | RESPIRATORY_TRACT | Status: DC
  Administered 2020-05-02 – 2020-05-03 (×4): 2 via RESPIRATORY_TRACT

## 2020-05-02 MED ORDER — ENOXAPARIN SODIUM 150 MG/ML ~~LOC~~ SOLN
140.0000 mg | Freq: Two times a day (BID) | SUBCUTANEOUS | Status: DC
  Administered 2020-05-02 – 2020-05-06 (×8): 140 mg via SUBCUTANEOUS
  Filled 2020-05-02 (×5): qty 0.94
  Filled 2020-05-02: qty 1
  Filled 2020-05-02 (×2): qty 0.94
  Filled 2020-05-02: qty 1
  Filled 2020-05-02 (×5): qty 0.94

## 2020-05-02 NOTE — Progress Notes (Signed)
ANTICOAGULATION CONSULT NOTE - Initial Consult  Pharmacy Consult for Lovenox Indication: VTE tx  Allergies  Allergen Reactions  . Penicillins     Did it involve swelling of the face/tongue/throat, SOB, or low BP? yes Did it involve sudden or severe rash/hives, skin peeling, or any reaction on the inside of your mouth or nose? yes Did you need to seek medical attention at a hospital or doctor's office? no When did it last happen?"years ago" If all above answers are "NO", may proceed with cephalosporin use.     Patient Measurements: Height: 5\' 8"  (172.7 cm) Weight: (!) 189 kg (416 lb 10.7 oz) IBW/kg (Calculated) : 63.9 BMI 63%  Vital Signs: BP: 146/54 (09/18 1800)  Labs: Recent Labs    04/30/20 0349 04/30/20 0349 05/01/20 0559 05/02/20 0430  HGB 13.7   < > 13.8 14.6  HCT 43.3  --  44.0 46.8*  PLT 260  --  254 219  CREATININE 0.95  --  1.05* 0.94   < > = values in this interval not displayed.    Estimated Creatinine Clearance: 115.9 mL/min (by C-G formula based on SCr of 0.94 mg/dL).   Medical History: Past Medical History:  Diagnosis Date  . Arthritis     Medications:  Medications Prior to Admission  Medication Sig Dispense Refill Last Dose  . acetaminophen (TYLENOL) 500 MG tablet Take 1,000 mg by mouth every 6 (six) hours as needed for mild pain or fever.   05/10/2020 at Unknown time  . ibuprofen (ADVIL) 200 MG tablet Take 800 mg by mouth every 6 (six) hours as needed for fever or moderate pain.   Past Week at Unknown time  . naproxen sodium (ALEVE) 220 MG tablet Take 220 mg by mouth daily as needed (pain/fever).   Past Week at Unknown time    Assessment: 59 year old female with no documented chronic medical problems presenting with 1 week history of nonproductive cough, poor oral intake, shortness of breath, myalgias, and headache with associated loose stools.  The patient states that in the past 2 days her coughing and shortness of breath and generalized  weakness had worsened.  In addition, she has lost her sense of smell. She was diagnosed with acute respiratory fx secondary to COVID-19 PNA. CRP 16.1, D-dimer increased to 20. With worsening breathing and increased D-Dimer, MD changed lovenox px to treatment regimen. Will dose at 0.75mg /kg/dose d/t morbid obesity.  Goal of Therapy:  Anti-Xa level 0.6-1 units/ml 4hrs after LMWH dose given Monitor platelets by anticoagulation protocol: Yes   Plan:  Lovenox 140mg  sq q12 (0.75mg /kg sq q12h) Monitor for S/S of bleeding, anti-xa levels at steady state  46, BS Pharm D, BCPS Clinical Pharmacist Pager [phone] 05/02/2020,7:57 PM

## 2020-05-02 NOTE — Progress Notes (Signed)
PROGRESS NOTE  Lisa Diaz HTD:[id number] DOB: [birth year] DOA: 05/04/2020 PCP: Patient, No Pcp Per  Brief History:  59 year old female with no documented chronic medical problems presenting with 1 week history of nonproductive cough, poor oral intake, shortness of breath, myalgias, and headache with associated loose stools.  The patient states that in the past 2 days her coughing and shortness of breath and generalized weakness had worsened.  In addition, she has lost her sense of smell.  The patient has been complaining of loose stools for the past 3 days.  She has had 3-4 loose bowel movements daily.  She denies any hematochezia but complains of some black stool.  She states that she has been taking Pepto-Bismol.  The patient has been having some subjective fevers and chills.  She has not been vaccinated against COVID-19.  She lives with her foster daughter who had some type of viral infection 2 weeks prior to the patient's admission.  The patient went to urgent care on 04/15/2020.  She was noted to have oxygen saturation in the 60s.  She was sent to emergency department for further evaluation. In the emergency department, the patient had temperature up to 102.9 F.  She was hemodynamically stable.  Oxygen saturation was 88-90% on a nonrebreather.  The patient was started on Solu-Medrol and remdesivir.  Assessment/Plan: Acute respiratory failure secondary to COVID-19 pneumonia. -Continue IV Solu-Medrol day 6 -She completed remdesivir on 9/17 -Vitamin C and zinc -Encourage prone position -Incentive spirometry -continue baricitinib day 4 -CRP 22.1>17.7>11.0>16.1 -Ferritin 1457>3,063>3160>1341 -D-dimer 1.70>2.25>4.32>>20 -PCT 0.27 -Personally reviewed chest x-ray--bilateral patchy infiltrates -Personally reviewed EKG--sinus rhythm, no ST-T wave changes -currently requiring heated high flow oxygen and NRB mask -Tried bipap, but patient was having difficulty keeping mask  on -Discussed possibility of intubation and patient refused -Reviewed case with Dr. Craige Cotta and he agreed with current treatment.  -Since patient is DNR, there does not appear to be any further escalation of care to offer -can consider Bipap if patient's shortness of breath worsens, although this may be of limited benefit -with D dimer >20, will change lovenox to therapeutic dosing  -At present, she does not appear to be in distress and is mentating appropriately -continue current treatments.  Renal insufficiency, type unspecified -No prior values to compare -Presented with serum creatinine 1.36 -creatinine improved to 0.9 with hydration  Diarrhea -improved -stool studies not sent since she has not had further diarrhea  Morbid obesity -BMI 63 -At risk for increased morbidity and mortality from COVID-19 -lifestyle modification  Hyponatremia -resolved with IV fluids.  Goals of care -discussed goals of care in detail with patient -On multiple occasions, she affirmed that she would not want to receive CPR or to be placed on a ventilator -She understands that if her respiratory condition continues to deteriorate then she may not survive this illness -She also correctly states that even with a ventilator, her recovery would not be certain -If she continues to worsen, then " when it is my time, it is my time" -If the situation were to arise, she wished to focus on comfort -She has identified her sister Lisa Diaz as main point of contact -I updated her sister regarding the severity of patient's condition -Lisa Diaz initially asked if she could reverse the patient's DNR since she wishes for patient to be full code -I informed Lisa Diaz that ethically, since the patient has repeatedly told me and multiple other staff members that she would not  want to receive CPR/intubation and appears to understand the consequences of that decision, that I could not reverse CODE STATUS at this time. Lisa Diaz  understood and asked Lisa Diaz to continue current treatments. -palliative care following, continue current treatments  Status is: Inpatient  Remains inpatient appropriate because:IV treatments appropriate due to intensity of illness or inability to take PO   Dispo: The patient is from: Home              Anticipated d/c is to: Home              Anticipated d/c date is: > 3 days              Patient currently is not medically stable to d/c.   Family Communication:   Updated patient's sister, 9/17  Consultants:  none  Code Status: DNR, confirmed with patient  DVT Prophylaxis:   Therapeutic lovenox   Procedures: As Listed in Progress Note Above  Antibiotics: None   Subjective: Reports that she is resting well at night. Feels that breathing is improving, although she is still requiring HHFNC and NRB mask  Objective: Vitals:   05/02/20 1500 05/02/20 1600 05/02/20 1700 05/02/20 1800  BP: (!) 170/76 (!) 157/92 (!) 161/86 (!) 146/54  Pulse:      Resp: (!) 33 (!) 28 (!) 24 (!) 40  Temp:      TempSrc:      SpO2: (!) 84% (!) 84% (!) 80%   Weight:      Height:        Intake/Output Summary (Last 24 hours) at 05/02/2020 1938 Last data filed at 05/02/2020 1100 Gross per 24 hour  Intake 201.86 ml  Output 1000 ml  Net -798.14 ml   Weight change:  Exam:  General exam: Alert, awake, oriented x 3 Respiratory system: Clear to auscultation. Respiratory effort normal. Cardiovascular system:RRR. No murmurs, rubs, gallops. Gastrointestinal system: Abdomen is nondistended, soft and nontender. No organomegaly or masses felt. Normal bowel sounds heard. Central nervous system: Alert and oriented. No focal neurological deficits. Extremities: No C/C/E, +pedal pulses Skin: No rashes, lesions or ulcers Psychiatry: Judgement and insight appear normal. Mood & affect appropriate.     Data Reviewed: I have personally reviewed following labs and imaging studies Basic Metabolic Panel: Recent  Labs  Lab 04/28/20 0851 04/29/20 0501 04/30/20 0349 05/01/20 0559 05/02/20 0430  NA 135 139 141 141 142  K 4.1 3.9 4.1 4.1 4.3  CL 103 104 104 105 104  CO2 22 23 26 26 26   GLUCOSE 153* 153* 158* 149* 172*  BUN 24* 29* 34* 39* 34*  CREATININE 0.99 0.93 0.95 1.05* 0.94  CALCIUM 7.7* 8.0* 8.1* 8.0* 8.2*  MG 2.0 2.2 2.3 2.5*  --   PHOS 2.6 2.6 2.5 3.4  --    Liver Function Tests: Recent Labs  Lab 04/28/20 0851 04/29/20 0501 04/30/20 0349 05/01/20 0559 05/02/20 0430  AST 81* 93* 73* 68* 53*  ALT 32 41 38 40 42  ALKPHOS 41 48 78 102 135*  BILITOT 0.6 0.4 0.6 0.6 0.5  PROT 7.2 7.2 7.4 7.3 7.7  ALBUMIN 2.6* 2.5* 2.5* 2.4* 2.5*   No results for input(s): LIPASE, AMYLASE in the last 168 hours. No results for input(s): AMMONIA in the last 168 hours. Coagulation Profile: No results for input(s): INR, PROTIME in the last 168 hours. CBC: Recent Labs  Lab 04/28/20 0851 04/29/20 0501 04/30/20 0349 05/01/20 0559 05/02/20 0430  WBC 6.4 7.2 11.8* 12.8* 17.3*  NEUTROABS 5.5 5.6 10.0* 10.6* 14.3*  HGB 13.3 13.4 13.7 13.8 14.6  HCT 42.1 42.6 43.3 44.0 46.8*  MCV 91.9 91.4 91.2 91.9 92.7  PLT 148* 193 260 254 219   Cardiac Enzymes: No results for input(s): CKTOTAL, CKMB, CKMBINDEX, TROPONINI in the last 168 hours. BNP: Invalid input(s): POCBNP CBG: No results for input(s): GLUCAP in the last 168 hours. HbA1C: No results for input(s): HGBA1C in the last 72 hours. Urine analysis: No results found for: COLORURINE, APPEARANCEUR, LABSPEC, PHURINE, GLUCOSEU, HGBUR, BILIRUBINUR, KETONESUR, PROTEINUR, UROBILINOGEN, NITRITE, LEUKOCYTESUR Sepsis Labs: @LABRCNTIP (procalcitonin:4,lacticidven:4) ) Recent Results (from the past 240 hour(s))  SARS Coronavirus 2 by RT PCR (hospital order, performed in [facility] hospital lab) Nasopharyngeal Nasopharyngeal Swab     Status: Abnormal   Collection Time: 05/05/2020  7:20 PM   Specimen: Nasopharyngeal Swab  Result Value Ref Range Status    SARS Coronavirus 2 POSITIVE (A) NEGATIVE Final    Comment: RESULT CALLED TO, READ BACK BY AND VERIFIED WITH: T WALKER,RN@2149  05/12/2020 MKELLY (NOTE) SARS-CoV-2 target nucleic acids are DETECTED  SARS-CoV-2 RNA is generally detectable in upper respiratory specimens  during the acute phase of infection.  Positive results are indicative  of the presence of the identified virus, but do not rule out bacterial infection or co-infection with other pathogens not detected by the test.  Clinical correlation with patient history and  other diagnostic information is necessary to determine patient infection status.  The expected result is negative.  Fact Sheet for Patients:   04/29/20   Fact Sheet for Healthcare Providers:   BoilerBrush.com.cy    This test is not yet approved or cleared by the https://pope.com/ FDA and  has been authorized for detection and/or diagnosis of SARS-CoV-2 by FDA under an Emergency Use Authorization (EUA).  This EUA will remain in effect (meaning this test  can be used) for the duration of  the COVID-19 declaration under Section 564(b)(1) of the Act, 21 U.S.C. section 360-bbb-3(b)(1), unless the authorization is terminated or revoked sooner.  Performed at [facility], [address], [place], [place]   Blood Culture (routine x 2)     Status: None   Collection Time: 05/06/2020  7:48 PM   Specimen: BLOOD  Result Value Ref Range Status   Specimen Description BLOOD RIGHT ANTECUBITAL  Final   Special Requests   Final    BOTTLES DRAWN AEROBIC AND ANAEROBIC Blood Culture adequate volume   Culture   Final    NO GROWTH 5 DAYS Performed at [facility], [address], [place], [place]    Report Status 05/02/2020 FINAL  Final  Blood Culture (routine x 2)     Status: None   Collection Time: 04/26/2020  8:37 PM   Specimen: BLOOD LEFT HAND  Result Value Ref Range Status   Specimen Description BLOOD LEFT  HAND  Final   Special Requests   Final    BOTTLES DRAWN AEROBIC AND ANAEROBIC Blood Culture adequate volume   Culture   Final    NO GROWTH 5 DAYS Performed at [facility], [address], [place], [place]    Report Status 05/02/2020 FINAL  Final  MRSA PCR Screening     Status: None   Collection Time: 04/29/20 10:51 PM   Specimen: Nasal Mucosa; Nasopharyngeal  Result Value Ref Range Status   MRSA by PCR NEGATIVE NEGATIVE Final    Comment:        The GeneXpert MRSA Assay (FDA approved for  NASAL specimens only), is one component of a comprehensive MRSA colonization surveillance program. It is not intended to diagnose MRSA infection nor to guide or monitor treatment for MRSA infections. Performed at [facility], [address] [place], [place] [address]      Scheduled Meds: . albuterol  2 puff Inhalation Q6H WA  . vitamin C  500 mg Oral Daily  . baricitinib  4 mg Oral Daily  . Chlorhexidine Gluconate Cloth  6 each Topical Daily  . dextromethorphan-guaiFENesin  1 tablet Oral BID  . enoxaparin (LOVENOX) injection  80 mg Subcutaneous Q24H  . zinc sulfate  220 mg Oral Daily   Continuous Infusions: . methylPREDNISolone (SOLU-MEDROL) injection 160 mg (05/02/20 0826)    Procedures/Studies: DG Chest Port 1 View  Result Date: May 01, 2020 CLINICAL DATA:  Shortness of breath. EXAM: PORTABLE CHEST 1 VIEW COMPARISON:  None. FINDINGS: Mild cardiomegaly is noted. No pneumothorax is noted. Multiple small ill-defined opacities are noted throughout both lungs concerning for possible multifocal pneumonia. Left pleural effusion cannot be excluded. Bony thorax is unremarkable. IMPRESSION: Multiple small ill-defined opacities are noted throughout both lungs concerning for possible multifocal pneumonia. Left pleural effusion cannot be excluded. Electronically Signed   By: Lupita Raider M.D.   On: 05-01-20 19:42    Erick Blinks, MD  [facility]  If 7PM-7AM, please contact  night-coverage [email or web address]  05/02/2020, 7:38 PM   LOS: 5 days

## 2020-05-02 NOTE — Progress Notes (Signed)
Talked to patient about wearing CPAP at night. She still refused. Will continue to help her as much as she will allow. She is very pleasant.

## 2020-05-03 LAB — CBC
HCT: 43.7 % (ref 36.0–46.0)
Hemoglobin: 13.7 g/dL (ref 12.0–15.0)
MCH: 28.9 pg (ref 26.0–34.0)
MCHC: 31.4 g/dL (ref 30.0–36.0)
MCV: 92.2 fL (ref 80.0–100.0)
Platelets: 247 10*3/uL (ref 150–400)
RBC: 4.74 MIL/uL (ref 3.87–5.11)
RDW: 13.9 % (ref 11.5–15.5)
WBC: 21.2 10*3/uL — ABNORMAL HIGH (ref 4.0–10.5)
nRBC: 0.4 % — ABNORMAL HIGH (ref 0.0–0.2)

## 2020-05-03 MED ORDER — ALBUTEROL SULFATE HFA 108 (90 BASE) MCG/ACT IN AERS
2.0000 | INHALATION_SPRAY | Freq: Four times a day (QID) | RESPIRATORY_TRACT | Status: DC | PRN
  Administered 2020-05-08: 2 via RESPIRATORY_TRACT

## 2020-05-03 NOTE — Progress Notes (Signed)
Patients saturation dropped in to 70's due to patient being asleep and NRB mask coming loose. Managed to talk patient in to trying BiPAP through vent. Placed BiPAP on over Heated high flow cannula. Settings are low 14/8 f 8 100%. Patient is tolerating well. Saturation 98. Hopefully she will be able to sleep better.

## 2020-05-03 NOTE — Progress Notes (Signed)
PROGRESS NOTE  Lisa Diaz YQM:[id number] DOB: June 10, 1961 DOA: 05/08/2020 PCP: Patient, No Pcp Per  Brief History:  59 year old female with no documented chronic medical problems presenting with 1 week history of nonproductive cough, poor oral intake, shortness of breath, myalgias, and headache with associated loose stools.  The patient states that in the past 2 days her coughing and shortness of breath and generalized weakness had worsened.  In addition, she has lost her sense of smell.  The patient has been complaining of loose stools for the past 3 days.  She has had 3-4 loose bowel movements daily.  She denies any hematochezia but complains of some black stool.  She states that she has been taking Pepto-Bismol.  The patient has been having some subjective fevers and chills.  She has not been vaccinated against COVID-19.  She lives with her foster daughter who had some type of viral infection 2 weeks prior to the patient's admission.  The patient went to urgent care on 08-May-2020.  She was noted to have oxygen saturation in the 60s.  She was sent to emergency department for further evaluation. In the emergency department, the patient had temperature up to 102.9 F.  She was hemodynamically stable.  Oxygen saturation was 88-90% on a nonrebreather.  The patient was started on Solu-Medrol and remdesivir.  Assessment/Plan: Acute respiratory failure secondary to COVID-19 pneumonia. -Continue IV Solu-Medrol day 6 -She completed remdesivir on 9/17 -Vitamin C and zinc -Encourage prone position -Incentive spirometry -continue baricitinib day 6 -CRP 22.1>17.7>11.0>16.1 -Ferritin 1457>3,063>3160>1341 -D-dimer 1.70>2.25>4.32>>20 -PCT 0.27 -Personally reviewed chest x-ray--bilateral patchy infiltrates -Personally reviewed EKG--sinus rhythm, no ST-T wave changes -currently requiring heated high flow oxygen and NRB mask -Tried bipap, but patient was having difficulty keeping mask  on -Discussed possibility of intubation and patient refused -Reviewed case with Dr. Craige Cotta and he agreed with current treatment.  -Since patient is DNR, there does not appear to be any further escalation of care to offer -can consider Bipap if patient's shortness of breath worsens, although this may be of limited benefit -with D dimer >20, will change lovenox to therapeutic dosing -Checking CTA chest to rule out PE.  -At present, she does not appear to be in distress and is mentating appropriately -continue current treatments.  Renal insufficiency, type unspecified -No prior values to compare -Presented with serum creatinine 1.36 -creatinine improved to 0.9 with hydration  Diarrhea -improved -stool studies not sent since she has not had further diarrhea  Morbid obesity -BMI 63 -At risk for increased morbidity and mortality from COVID-19 -lifestyle modification  Hyponatremia -resolved with IV fluids.  Goals of care -discussed goals of care in detail with patient -On multiple occasions, she affirmed that she would not want to receive CPR or to be placed on a ventilator -She understands that if her respiratory condition continues to deteriorate then she may not survive this illness -She also correctly states that even with a ventilator, her recovery would not be certain -If she continues to worsen, then " when it is my time, it is my time" -If the situation were to arise, she wished to focus on comfort -She has identified her sister [name] as main point of contact -I updated her sister regarding the severity of patient's condition -[name] initially asked if she could reverse the patient's DNR since she wishes for patient to be full code -I informed [name] that ethically, since the patient has repeatedly told me and multiple  other staff members that she would not want to receive CPR/intubation and appears to understand the consequences of that decision, that I could not reverse  CODE STATUS at this time. [name] understood and asked [place] to continue current treatments. -palliative care following, continue current treatments  Status is: Inpatient  Remains inpatient appropriate because:IV treatments appropriate due to intensity of illness or inability to take PO   Dispo: The patient is from: Home              Anticipated d/c is to: Home              Anticipated d/c date is: > 3 days              Patient currently is not medically stable to d/c.   Family Communication:   Updated patient's sister, 9/19  Consultants:  none  Code Status: DNR, confirmed with patient  DVT Prophylaxis:   Therapeutic lovenox   Procedures: As Listed in Progress Note Above  Antibiotics: None   Subjective: Continues to feel short of breath.  Requiring nonrebreather mask and heated high flow.  Objective: Vitals:   05/03/20 1500 05/03/20 1600 05/03/20 1700 05/03/20 1800  BP: (!) 166/85 129/80 (!) 164/92 (!) 160/83  Pulse:      Resp: (!) 22 (!) 40 (!) 30 15  Temp:      TempSrc:      SpO2: 97% 96% 96% 97%  Weight:      Height:        Intake/Output Summary (Last 24 hours) at 05/03/2020 1837 Last data filed at 05/03/2020 1800 Gross per 24 hour  Intake 393.9 ml  Output 400 ml  Net -6.1 ml   Weight change:  Exam:  General exam: Alert, awake, oriented x 3 Respiratory system: Clear to auscultation. Respiratory effort normal. Cardiovascular system:RRR. No murmurs, rubs, gallops. Gastrointestinal system: Abdomen is nondistended, soft and nontender. No organomegaly or masses felt. Normal bowel sounds heard. Central nervous system: Alert and oriented. No focal neurological deficits. Extremities: No C/C/E, +pedal pulses Skin: No rashes, lesions or ulcers Psychiatry: Judgement and insight appear normal. Mood & affect appropriate.      Data Reviewed: I have personally reviewed following labs and imaging studies Basic Metabolic Panel: Recent Labs  Lab 04/28/20 0851  04/29/20 0501 04/30/20 0349 05/01/20 0559 05/02/20 0430  NA 135 139 141 141 142  K 4.1 3.9 4.1 4.1 4.3  CL 103 104 104 105 104  CO2 22 23 26 26 26   GLUCOSE 153* 153* 158* 149* 172*  BUN 24* 29* 34* 39* 34*  CREATININE 0.99 0.93 0.95 1.05* 0.94  CALCIUM 7.7* 8.0* 8.1* 8.0* 8.2*  MG 2.0 2.2 2.3 2.5*  --   PHOS 2.6 2.6 2.5 3.4  --    Liver Function Tests: Recent Labs  Lab 04/28/20 0851 04/29/20 0501 04/30/20 0349 05/01/20 0559 05/02/20 0430  AST 81* 93* 73* 68* 53*  ALT 32 41 38 40 42  ALKPHOS 41 48 78 102 135*  BILITOT 0.6 0.4 0.6 0.6 0.5  PROT 7.2 7.2 7.4 7.3 7.7  ALBUMIN 2.6* 2.5* 2.5* 2.4* 2.5*   No results for input(s): LIPASE, AMYLASE in the last 168 hours. No results for input(s): AMMONIA in the last 168 hours. Coagulation Profile: No results for input(s): INR, PROTIME in the last 168 hours. CBC: Recent Labs  Lab 04/28/20 0851 04/28/20 0851 04/29/20 0501 04/30/20 0349 05/01/20 0559 05/02/20 0430 05/03/20 0603  WBC 6.4   < > 7.2 11.8*  12.8* 17.3* 21.2*  NEUTROABS 5.5  --  5.6 10.0* 10.6* 14.3*  --   HGB 13.3   < > 13.4 13.7 13.8 14.6 13.7  HCT 42.1   < > 42.6 43.3 44.0 46.8* 43.7  MCV 91.9   < > 91.4 91.2 91.9 92.7 92.2  PLT 148*   < > 193 260 254 219 247   < > = values in this interval not displayed.   Cardiac Enzymes: No results for input(s): CKTOTAL, CKMB, CKMBINDEX, TROPONINI in the last 168 hours. BNP: Invalid input(s): POCBNP CBG: No results for input(s): GLUCAP in the last 168 hours. HbA1C: No results for input(s): HGBA1C in the last 72 hours. Urine analysis: No results found for: COLORURINE, APPEARANCEUR, LABSPEC, PHURINE, GLUCOSEU, HGBUR, BILIRUBINUR, KETONESUR, PROTEINUR, UROBILINOGEN, NITRITE, LEUKOCYTESUR Sepsis Labs: @LABRCNTIP (procalcitonin:4,lacticidven:4) ) Recent Results (from the past 240 hour(s))  SARS Coronavirus 2 by RT PCR (hospital order, performed in [facility] hospital lab) Nasopharyngeal Nasopharyngeal Swab     Status:  Abnormal   Collection Time: 05/12/2020  7:20 PM   Specimen: Nasopharyngeal Swab  Result Value Ref Range Status   SARS Coronavirus 2 POSITIVE (A) NEGATIVE Final    Comment: RESULT CALLED TO, READ BACK BY AND VERIFIED WITH: T WALKER,RN@2149  05/04/2020 MKELLY (NOTE) SARS-CoV-2 target nucleic acids are DETECTED  SARS-CoV-2 RNA is generally detectable in upper respiratory specimens  during the acute phase of infection.  Positive results are indicative  of the presence of the identified virus, but do not rule out bacterial infection or co-infection with other pathogens not detected by the test.  Clinical correlation with patient history and  other diagnostic information is necessary to determine patient infection status.  The expected result is negative.  Fact Sheet for Patients:   04/29/20   Fact Sheet for Healthcare Providers:   BoilerBrush.com.cy    This test is not yet approved or cleared by the https://pope.com/ FDA and  has been authorized for detection and/or diagnosis of SARS-CoV-2 by FDA under an Emergency Use Authorization (EUA).  This EUA will remain in effect (meaning this test  can be used) for the duration of  the COVID-19 declaration under Section 564(b)(1) of the Act, 21 U.S.C. section 360-bbb-3(b)(1), unless the authorization is terminated or revoked sooner.  Performed at [facility], [address], [place], [place]   Blood Culture (routine x 2)     Status: None   Collection Time: 05/06/2020  7:48 PM   Specimen: BLOOD  Result Value Ref Range Status   Specimen Description BLOOD RIGHT ANTECUBITAL  Final   Special Requests   Final    BOTTLES DRAWN AEROBIC AND ANAEROBIC Blood Culture adequate volume   Culture   Final    NO GROWTH 5 DAYS Performed at [facility], [address] [place], [place]    Report Status 05/02/2020 FINAL  Final  Blood Culture (routine x 2)     Status: None   Collection  Time: 05/02/2020  8:37 PM   Specimen: BLOOD LEFT HAND  Result Value Ref Range Status   Specimen Description BLOOD LEFT HAND  Final   Special Requests   Final    BOTTLES DRAWN AEROBIC AND ANAEROBIC Blood Culture adequate volume   Culture   Final    NO GROWTH 5 DAYS Performed at [facility], [address] [place], [place]    Report Status 05/02/2020 FINAL  Final  MRSA PCR Screening     Status: None   Collection Time:  04/29/20 10:51 PM   Specimen: Nasal Mucosa; Nasopharyngeal  Result Value Ref Range Status   MRSA by PCR NEGATIVE NEGATIVE Final    Comment:        The GeneXpert MRSA Assay (FDA approved for NASAL specimens only), is one component of a comprehensive MRSA colonization surveillance program. It is not intended to diagnose MRSA infection nor to guide or monitor treatment for MRSA infections. Performed at [facility], [address] [place], [place] [address]      Scheduled Meds: . albuterol  2 puff Inhalation Q6H WA  . vitamin C  500 mg Oral Daily  . baricitinib  4 mg Oral Daily  . Chlorhexidine Gluconate Cloth  6 each Topical Daily  . dextromethorphan-guaiFENesin  1 tablet Oral BID  . enoxaparin (LOVENOX) injection  140 mg Subcutaneous Q12H  . zinc sulfate  220 mg Oral Daily   Continuous Infusions: . methylPREDNISolone (SOLU-MEDROL) injection 160 mg (05/03/20 0745)    Procedures/Studies: DG Chest Port 1 View  Result Date: 04/23/2020 CLINICAL DATA:  Shortness of breath. EXAM: PORTABLE CHEST 1 VIEW COMPARISON:  None. FINDINGS: Mild cardiomegaly is noted. No pneumothorax is noted. Multiple small ill-defined opacities are noted throughout both lungs concerning for possible multifocal pneumonia. Left pleural effusion cannot be excluded. Bony thorax is unremarkable. IMPRESSION: Multiple small ill-defined opacities are noted throughout both lungs concerning for possible multifocal pneumonia. Left pleural effusion cannot be excluded. Electronically Signed   By:  Lupita RaiderJames  Green Jr M.D.   On: 04/23/2020 19:42    Lisa BlinksJehanzeb Aundray Cartlidge, MD  [facility]  If 7PM-7AM, please contact night-coverage [email or web address]  05/03/2020, 6:37 PM   LOS: 6 days

## 2020-05-04 ENCOUNTER — Inpatient Hospital Stay (HOSPITAL_COMMUNITY): Payer: HRSA Program

## 2020-05-04 LAB — COMPREHENSIVE METABOLIC PANEL
ALT: 44 U/L (ref 0–44)
AST: 69 U/L — ABNORMAL HIGH (ref 15–41)
Albumin: 2.2 g/dL — ABNORMAL LOW (ref 3.5–5.0)
Alkaline Phosphatase: 155 U/L — ABNORMAL HIGH (ref 38–126)
Anion gap: 13 (ref 5–15)
BUN: 32 mg/dL — ABNORMAL HIGH (ref 6–20)
CO2: 24 mmol/L (ref 22–32)
Calcium: 7.9 mg/dL — ABNORMAL LOW (ref 8.9–10.3)
Chloride: 104 mmol/L (ref 98–111)
Creatinine, Ser: 1 mg/dL (ref 0.44–1.00)
GFR calc Af Amer: >60 mL/min (ref >60)
GFR calc non Af Amer: >60 mL/min (ref >60)
Glucose, Bld: 185 mg/dL — ABNORMAL HIGH (ref 70–99)
Potassium: 4.3 mmol/L (ref 3.5–5.1)
Sodium: 141 mmol/L (ref 135–145)
Total Bilirubin: 0.8 mg/dL (ref 0.3–1.2)
Total Protein: 6.8 g/dL (ref 6.5–8.1)

## 2020-05-04 LAB — CBC
HCT: 45 % (ref 36.0–46.0)
Hemoglobin: 13.8 g/dL (ref 12.0–15.0)
MCH: 28.5 pg (ref 26.0–34.0)
MCHC: 30.7 g/dL (ref 30.0–36.0)
MCV: 93 fL (ref 80.0–100.0)
Platelets: 302 10*3/uL (ref 150–400)
RBC: 4.84 MIL/uL (ref 3.87–5.11)
RDW: 13.6 % (ref 11.5–15.5)
WBC: 18.9 10*3/uL — ABNORMAL HIGH (ref 4.0–10.5)
nRBC: 0.4 % — ABNORMAL HIGH (ref 0.0–0.2)

## 2020-05-04 LAB — FERRITIN: Ferritin: 1220 ng/mL — ABNORMAL HIGH (ref 11–307)

## 2020-05-04 LAB — D-DIMER, QUANTITATIVE: D-Dimer, Quant: >20 ug/mL-FEU — ABNORMAL HIGH (ref 0.00–0.50)

## 2020-05-04 LAB — C-REACTIVE PROTEIN: CRP: 3.7 mg/dL — ABNORMAL HIGH (ref <1.0)

## 2020-05-04 MED ORDER — MORPHINE SULFATE (PF) 2 MG/ML IV SOLN
2.0000 mg | INTRAVENOUS | Status: DC | PRN
  Administered 2020-05-04 – 2020-05-11 (×7): 2 mg via INTRAVENOUS
  Filled 2020-05-04 (×7): qty 1

## 2020-05-04 MED ORDER — FUROSEMIDE 10 MG/ML IJ SOLN
40.0000 mg | Freq: Once | INTRAMUSCULAR | Status: AC
  Administered 2020-05-04: 40 mg via INTRAVENOUS
  Filled 2020-05-04: qty 4

## 2020-05-04 NOTE — Progress Notes (Signed)
Patient awakened sometime around 0500 struggling to breath. Unable to get saturation back up on high flow [place], place on BiPAP. 18/10 f 24 100. Saturation finally made it to 97. Patient can lie on her side but is to obese for stomach.

## 2020-05-04 NOTE — Progress Notes (Signed)
Removed patient from Bipap and placed on [place] high flow at 15lpm [place] and NRB. Pt has SPO2 between 93 and 97%. RR is in the mid to high 20s. She is a little anxious and has asked for something to help her relax. I will convey message to the RN.

## 2020-05-04 NOTE — Progress Notes (Signed)
PROGRESS NOTE  Lisa Diaz ZOX:[id number] DOB: 01/06/1961 DOA: 04/25/2020 PCP: Patient, No Pcp Per  Brief History:  59 year old female with no documented chronic medical problems presenting with 1 week history of nonproductive cough, poor oral intake, shortness of breath, myalgias, and headache with associated loose stools.  The patient states that in the past 2 days her coughing and shortness of breath and generalized weakness had worsened.  In addition, she has lost her sense of smell.  The patient has been complaining of loose stools for the past 3 days.  She has had 3-4 loose bowel movements daily.  She denies any hematochezia but complains of some black stool.  She states that she has been taking Pepto-Bismol.  The patient has been having some subjective fevers and chills.  She has not been vaccinated against COVID-19.  She lives with her foster daughter who had some type of viral infection 2 weeks prior to the patient's admission.  The patient went to urgent care on 04/16/2020.  She was noted to have oxygen saturation in the 60s.  She was sent to emergency department for further evaluation. In the emergency department, the patient had temperature up to 102.9 F.  She was hemodynamically stable.  Oxygen saturation was 88-90% on a nonrebreather.  The patient was started on Solu-Medrol and remdesivir.  Assessment/Plan: Acute respiratory failure secondary to COVID-19 pneumonia. -Continue IV Solu-Medrol day 7 -She completed remdesivir on 9/17 -Vitamin C and zinc -Encourage prone position -Incentive spirometry -continue baricitinib day 7 -CRP 22.1>17.7>11.0>16.1>3.7 -Ferritin 1457>3,063>3160>1341>1220 -D-dimer 1.70>2.25>4.32>>20>>20 -PCT 0.27 -Personally reviewed chest x-ray--bilateral patchy infiltrates -Personally reviewed EKG--sinus rhythm, no ST-T wave changes -currently requiring heated high flow oxygen and NRB mask -Tried bipap, but patient was having difficulty keeping  mask on -Discussed possibility of intubation and patient refused -Reviewed case with Dr. Craige CottaSood and he agreed with current treatment.  -Since patient is DNR, there does not appear to be any further escalation of care to offer -can consider Bipap if patient's shortness of breath worsens, although this may be of limited benefit -with D dimer >20, will change lovenox to therapeutic dosing -Checking CTA chest to rule out PE and venous dopplers of LE.  -At present, she does not appear to be in distress and is mentating appropriately -continue current treatments.  AKI -No prior values to compare -Presented with serum creatinine 1.36 -creatinine improved to 0.9 with hydration  Diarrhea -improved -stool studies not sent since she has not had further diarrhea  Morbid obesity -BMI 63 -At risk for increased morbidity and mortality from COVID-19 -lifestyle modification  Hyponatremia -resolved with IV fluids.  Goals of care -discussed goals of care in detail with patient -On multiple occasions, she affirmed that she would not want to receive CPR or to be placed on a ventilator -She understands that if her respiratory condition continues to deteriorate then she may not survive this illness -She also correctly states that even with a ventilator, her recovery would not be certain -If she continues to worsen, then " when it is my time, it is my time" -If the situation were to arise, she wished to focus on comfort -She has identified her sister [name] as main point of contact -I updated her sister regarding the severity of patient's condition -[name] initially asked if she could reverse the patient's DNR since she wishes for patient to be full code -I informed [name] that ethically, since the patient has repeatedly told me  and multiple other staff members that she would not want to receive CPR/intubation and appears to understand the consequences of that decision, that I could not reverse  CODE STATUS at this time. [name] understood and asked [place] to continue current treatments. -palliative care following, continue current treatments  Status is: Inpatient  Remains inpatient appropriate because:IV treatments appropriate due to intensity of illness or inability to take PO   Dispo: The patient is from: Home              Anticipated d/c is to: Home              Anticipated d/c date is: > 3 days              Patient currently is not medically stable to d/c.   Family Communication:   Updated patient's sister, 9/20  Consultants:  none  Code Status: DNR, confirmed with patient  DVT Prophylaxis:   Therapeutic lovenox   Procedures: As Listed in Progress Note Above  Antibiotics: None   Subjective: Had increasing shortness of breath this morning.  Was placed on BiPAP in addition to heated high flow.  Objective: Vitals:   05/04/20 0904 05/04/20 1000 05/04/20 1147 05/04/20 1613  BP:      Pulse:      Resp:  19 (!) 24 (!) 26  Temp:   98.2 F (36.8 C) 98.7 F (37.1 C)  TempSrc:   Axillary Axillary  SpO2: 97%     Weight:      Height:        Intake/Output Summary (Last 24 hours) at 05/04/2020 1844 Last data filed at 05/04/2020 0657 Gross per 24 hour  Intake 50 ml  Output 175 ml  Net -125 ml   Weight change:  Exam:  General exam: Alert, awake, oriented x 3 Respiratory system: Clear to auscultation. Respiratory effort normal. Cardiovascular system:RRR. No murmurs, rubs, gallops. Gastrointestinal system: Abdomen is nondistended, soft and nontender. No organomegaly or masses felt. Normal bowel sounds heard. Central nervous system: Alert and oriented. No focal neurological deficits. Extremities: No C/C/E, +pedal pulses Skin: No rashes, lesions or ulcers Psychiatry: Judgement and insight appear normal. Mood & affect appropriate.      Data Reviewed: I have personally reviewed following labs and imaging studies Basic Metabolic Panel: Recent Labs  Lab  04/28/20 0851 04/28/20 0851 04/29/20 0501 04/30/20 0349 05/01/20 0559 05/02/20 0430 05/04/20 0613  NA 135   < > 139 141 141 142 141  K 4.1   < > 3.9 4.1 4.1 4.3 4.3  CL 103   < > 104 104 105 104 104  CO2 22   < > 23 26 26 26 24   GLUCOSE 153*   < > 153* 158* 149* 172* 185*  BUN 24*   < > 29* 34* 39* 34* 32*  CREATININE 0.99   < > 0.93 0.95 1.05* 0.94 1.00  CALCIUM 7.7*   < > 8.0* 8.1* 8.0* 8.2* 7.9*  MG 2.0  --  2.2 2.3 2.5*  --   --   PHOS 2.6  --  2.6 2.5 3.4  --   --    < > = values in this interval not displayed.   Liver Function Tests: Recent Labs  Lab 04/29/20 0501 04/30/20 0349 05/01/20 0559 05/02/20 0430 05/04/20 0613  AST 93* 73* 68* 53* 69*  ALT 41 38 40 42 44  ALKPHOS 48 78 102 135* 155*  BILITOT 0.4 0.6 0.6 0.5 0.8  PROT 7.2  7.4 7.3 7.7 6.8  ALBUMIN 2.5* 2.5* 2.4* 2.5* 2.2*   No results for input(s): LIPASE, AMYLASE in the last 168 hours. No results for input(s): AMMONIA in the last 168 hours. Coagulation Profile: No results for input(s): INR, PROTIME in the last 168 hours. CBC: Recent Labs  Lab 04/28/20 0851 04/28/20 0851 04/29/20 0501 04/29/20 0501 04/30/20 0349 05/01/20 0559 05/02/20 0430 05/03/20 0603 05/04/20 0613  WBC 6.4   < > 7.2   < > 11.8* 12.8* 17.3* 21.2* 18.9*  NEUTROABS 5.5  --  5.6  --  10.0* 10.6* 14.3*  --   --   HGB 13.3   < > 13.4   < > 13.7 13.8 14.6 13.7 13.8  HCT 42.1   < > 42.6   < > 43.3 44.0 46.8* 43.7 45.0  MCV 91.9   < > 91.4   < > 91.2 91.9 92.7 92.2 93.0  PLT 148*   < > 193   < > 260 254 219 247 302   < > = values in this interval not displayed.   Cardiac Enzymes: No results for input(s): CKTOTAL, CKMB, CKMBINDEX, TROPONINI in the last 168 hours. BNP: Invalid input(s): POCBNP CBG: No results for input(s): GLUCAP in the last 168 hours. HbA1C: No results for input(s): HGBA1C in the last 72 hours. Urine analysis: No results found for: COLORURINE, APPEARANCEUR, LABSPEC, PHURINE, GLUCOSEU, HGBUR, BILIRUBINUR,  KETONESUR, PROTEINUR, UROBILINOGEN, NITRITE, LEUKOCYTESUR Sepsis Labs: @LABRCNTIP (procalcitonin:4,lacticidven:4) ) Recent Results (from the past 240 hour(s))  SARS Coronavirus 2 by RT PCR (hospital order, performed in [facility] hospital lab) Nasopharyngeal Nasopharyngeal Swab     Status: Abnormal   Collection Time: 05/05/2020  7:20 PM   Specimen: Nasopharyngeal Swab  Result Value Ref Range Status   SARS Coronavirus 2 POSITIVE (A) NEGATIVE Final    Comment: RESULT CALLED TO, READ BACK BY AND VERIFIED WITH: T WALKER,RN@[id number]  05/14/2020 MKELLY (NOTE) SARS-CoV-2 target nucleic acids are DETECTED  SARS-CoV-2 RNA is generally detectable in upper respiratory specimens  during the acute phase of infection.  Positive results are indicative  of the presence of the identified virus, but do not rule out bacterial infection or co-infection with other pathogens not detected by the test.  Clinical correlation with patient history and  other diagnostic information is necessary to determine patient infection status.  The expected result is negative.  Fact Sheet for Patients:   04/29/20   Fact Sheet for Healthcare Providers:   BoilerBrush.com.cy    This test is not yet approved or cleared by the https://pope.com/ FDA and  has been authorized for detection and/or diagnosis of SARS-CoV-2 by FDA under an Emergency Use Authorization (EUA).  This EUA will remain in effect (meaning this test  can be used) for the duration of  the COVID-19 declaration under Section 564(b)(1) of the Act, 21 U.S.C. section 360-bbb-3(b)(1), unless the authorization is terminated or revoked sooner.  Performed at [facility], [address] [place], [place]   Blood Culture (routine x 2)     Status: None   Collection Time: 04/15/2020  7:48 PM   Specimen: BLOOD  Result Value Ref Range Status   Specimen Description BLOOD RIGHT ANTECUBITAL  Final   Special Requests    Final    BOTTLES DRAWN AEROBIC AND ANAEROBIC Blood Culture adequate volume   Culture   Final    NO GROWTH 5 DAYS Performed at [facility], [address], [place], [place]    Report Status 05/02/2020  FINAL  Final  Blood Culture (routine x 2)     Status: None   Collection Time: 05/02/2020  8:37 PM   Specimen: BLOOD LEFT HAND  Result Value Ref Range Status   Specimen Description BLOOD LEFT HAND  Final   Special Requests   Final    BOTTLES DRAWN AEROBIC AND ANAEROBIC Blood Culture adequate volume   Culture   Final    NO GROWTH 5 DAYS Performed at [facility], [address] [place], [place] [address]    Report Status 05/02/2020 FINAL  Final  MRSA PCR Screening     Status: None   Collection Time: 04/29/20 10:51 PM   Specimen: Nasal Mucosa; Nasopharyngeal  Result Value Ref Range Status   MRSA by PCR NEGATIVE NEGATIVE Final    Comment:        The GeneXpert MRSA Assay (FDA approved for NASAL specimens only), is one component of a comprehensive MRSA colonization surveillance program. It is not intended to diagnose MRSA infection nor to guide or monitor treatment for MRSA infections. Performed at [facility], [address] [place], [place] [address]      Scheduled Meds: . vitamin C  500 mg Oral Daily  . baricitinib  4 mg Oral Daily  . Chlorhexidine Gluconate Cloth  6 each Topical Daily  . dextromethorphan-guaiFENesin  1 tablet Oral BID  . enoxaparin (LOVENOX) injection  140 mg Subcutaneous Q12H  . zinc sulfate  220 mg Oral Daily   Continuous Infusions: . methylPREDNISolone (SOLU-MEDROL) injection 160 mg (05/04/20 0800)    Procedures/Studies: DG Chest Port 1 View  Result Date: 04/19/2020 CLINICAL DATA:  Shortness of breath. EXAM: PORTABLE CHEST 1 VIEW COMPARISON:  None. FINDINGS: Mild cardiomegaly is noted. No pneumothorax is noted. Multiple small ill-defined opacities are noted throughout both lungs concerning for possible multifocal pneumonia. Left pleural  effusion cannot be excluded. Bony thorax is unremarkable. IMPRESSION: Multiple small ill-defined opacities are noted throughout both lungs concerning for possible multifocal pneumonia. Left pleural effusion cannot be excluded. Electronically Signed   By: Lupita Raider M.D.   On: 05/10/2020 19:42    Erick Blinks, MD  [facility]  If 7PM-7AM, please contact night-coverage [email or web address]  05/04/2020, 6:44 PM   LOS: 7 days

## 2020-05-05 LAB — COMPREHENSIVE METABOLIC PANEL
ALT: 35 U/L (ref 0–44)
AST: 38 U/L (ref 15–41)
Albumin: 2.3 g/dL — ABNORMAL LOW (ref 3.5–5.0)
Alkaline Phosphatase: 137 U/L — ABNORMAL HIGH (ref 38–126)
Anion gap: 11 (ref 5–15)
BUN: 36 mg/dL — ABNORMAL HIGH (ref 6–20)
CO2: 27 mmol/L (ref 22–32)
Calcium: 8.1 mg/dL — ABNORMAL LOW (ref 8.9–10.3)
Chloride: 103 mmol/L (ref 98–111)
Creatinine, Ser: 0.9 mg/dL (ref 0.44–1.00)
GFR calc Af Amer: >60 mL/min (ref >60)
GFR calc non Af Amer: >60 mL/min (ref >60)
Glucose, Bld: 162 mg/dL — ABNORMAL HIGH (ref 70–99)
Potassium: 4.7 mmol/L (ref 3.5–5.1)
Sodium: 141 mmol/L (ref 135–145)
Total Bilirubin: 0.7 mg/dL (ref 0.3–1.2)
Total Protein: 7.2 g/dL (ref 6.5–8.1)

## 2020-05-05 LAB — CBC
HCT: 46.2 % — ABNORMAL HIGH (ref 36.0–46.0)
Hemoglobin: 14.5 g/dL (ref 12.0–15.0)
MCH: 29 pg (ref 26.0–34.0)
MCHC: 31.4 g/dL (ref 30.0–36.0)
MCV: 92.4 fL (ref 80.0–100.0)
Platelets: 368 10*3/uL (ref 150–400)
RBC: 5 MIL/uL (ref 3.87–5.11)
RDW: 13.8 % (ref 11.5–15.5)
WBC: 18.5 10*3/uL — ABNORMAL HIGH (ref 4.0–10.5)
nRBC: 0.5 % — ABNORMAL HIGH (ref 0.0–0.2)

## 2020-05-05 LAB — FERRITIN: Ferritin: 755 ng/mL — ABNORMAL HIGH (ref 11–307)

## 2020-05-05 LAB — C-REACTIVE PROTEIN: CRP: 2.8 mg/dL — ABNORMAL HIGH (ref <1.0)

## 2020-05-05 LAB — D-DIMER, QUANTITATIVE: D-Dimer, Quant: 13.67 ug/mL-FEU — ABNORMAL HIGH (ref 0.00–0.50)

## 2020-05-05 MED ORDER — FUROSEMIDE 10 MG/ML IJ SOLN
40.0000 mg | Freq: Once | INTRAMUSCULAR | Status: AC
  Administered 2020-05-05: 40 mg via INTRAVENOUS
  Filled 2020-05-05: qty 4

## 2020-05-05 MED ORDER — ENSURE ENLIVE PO LIQD
237.0000 mL | Freq: Three times a day (TID) | ORAL | Status: DC
  Administered 2020-05-05 – 2020-05-12 (×20): 237 mL via ORAL

## 2020-05-05 NOTE — Progress Notes (Signed)
PROGRESS NOTE  Lisa Diaz LTJ:[id number] DOB: August 18, 1960 DOA: 05/10/2020 PCP: Patient, No Pcp Per  Brief History:  58 year old female with no documented chronic medical problems presenting with 1 week history of nonproductive cough, poor oral intake, shortness of breath, myalgias, and headache with associated loose stools.  The patient states that in the past 2 days her coughing and shortness of breath and generalized weakness had worsened.  In addition, she has lost her sense of smell.  The patient has been complaining of loose stools for the past 3 days.  She has had 3-4 loose bowel movements daily.  She denies any hematochezia but complains of some black stool.  She states that she has been taking Pepto-Bismol.  The patient has been having some subjective fevers and chills.  She has not been vaccinated against COVID-19.  She lives with her foster daughter who had some type of viral infection 2 weeks prior to the patient's admission.  The patient went to urgent care on 05/09/2020.  She was noted to have oxygen saturation in the 60s.  She was sent to emergency department for further evaluation. In the emergency department, the patient had temperature up to 102.9 F.  She was hemodynamically stable.  Oxygen saturation was 88-90% on a nonrebreather.  The patient was started on Solu-Medrol and remdesivir.  Assessment/Plan: Acute respiratory failure secondary to COVID-19 pneumonia. -Continue IV Solu-Medrol day 8 -She completed remdesivir on 9/17 -Vitamin C and zinc -Encourage prone position -Incentive spirometry -continue baricitinib day 8 -CRP 22.1>17.7>11.0>16.1>3.7>2.8 -Ferritin 1457>3,063>3160>1341>1220>755 -D-dimer 1.70>2.25>4.32>>20>>20>13.6 -PCT 0.27 -Personally reviewed chest x-ray--bilateral patchy infiltrates -Personally reviewed EKG--sinus rhythm, no ST-T wave changes -currently requiring heated high flow oxygen and NRB mask -Discussed possibility of intubation and  patient refused -Reviewed case with Dr. Craige Cotta and he agreed with current treatment.  -Since patient is DNR, there does not appear to be any further escalation of care to offer -can consider Bipap if patient's shortness of breath worsens, although this may be of limited benefit -with D dimer >20, Lovenox was changed to therapeutic dosing -Checking CTA chest to rule out PE and venous dopplers of LE.  -At present, she does not appear to be in distress and is mentating appropriately -continue current treatments.  AKI -No prior values to compare -Presented with serum creatinine 1.36 -creatinine improved to 0.9 with hydration  Diarrhea -improved -stool studies not sent since she has not had further diarrhea  Morbid obesity -BMI 62 -At risk for increased morbidity and mortality from COVID-19 -lifestyle modification  Hyponatremia -resolved with IV fluids.  Goals of care -discussed goals of care in detail with patient -On multiple occasions, she affirmed that she would not want to receive CPR or to be placed on a ventilator -She understands that if her respiratory condition continues to deteriorate then she may not survive this illness -She also correctly states that even with a ventilator, her recovery would not be certain -If she continues to worsen, then " when it is my time, it is my time" -If the situation were to arise, she wished to focus on comfort -She has identified her sister [name] as main point of contact -I updated her sister regarding the severity of patient's condition -[name] initially asked if she could reverse the patient's DNR since she wishes for patient to be full code -I informed [name] that ethically, since the patient has repeatedly told me and multiple other staff members that she would not want  to receive CPR/intubation and appears to understand the consequences of that decision, that I could not reverse CODE STATUS at this time. [name] understood and  asked [place] to continue current treatments. -palliative care following, continue current treatments  Status is: Inpatient  Remains inpatient appropriate because:IV treatments appropriate due to intensity of illness or inability to take PO   Dispo: The patient is from: Home              Anticipated d/c is to: Home              Anticipated d/c date is: > 3 days              Patient currently is not medically stable to d/c.   Family Communication:   Updated patient's sister, 9/20  Consultants:  none  Code Status: DNR, confirmed with patient  DVT Prophylaxis:   Therapeutic lovenox   Procedures: As Listed in Progress Note Above  Antibiotics: None   Subjective: Continues to require heated high flow and nonrebreather mask.  Feels that she is marginally improved  Objective: Vitals:   05/05/20 1600 05/05/20 1638 05/05/20 1700 05/05/20 1800  BP: 134/77  139/79 134/85  Pulse:      Resp: 14 (!) 21  (!) 29  Temp:  98.6 F (37 C)    TempSrc:  Axillary    SpO2:      Weight:      Height:        Intake/Output Summary (Last 24 hours) at 05/05/2020 2013 Last data filed at 05/05/2020 1830 Gross per 24 hour  Intake 103.78 ml  Output 1800 ml  Net -1696.22 ml   Weight change:  Exam:  General exam: Alert, awake, oriented x 3 Respiratory system: Clear to auscultation. Respiratory effort normal. Cardiovascular system:RRR. No murmurs, rubs, gallops. Gastrointestinal system: Abdomen is nondistended, soft and nontender. No organomegaly or masses felt. Normal bowel sounds heard. Central nervous system: Alert and oriented. No focal neurological deficits. Extremities: No C/C/E, +pedal pulses Skin: No rashes, lesions or ulcers Psychiatry: Judgement and insight appear normal. Mood & affect appropriate.    Data Reviewed: I have personally reviewed following labs and imaging studies Basic Metabolic Panel: Recent Labs  Lab 04/29/20 0501 04/29/20 0501 04/30/20 0349 05/01/20 0559  05/02/20 0430 05/04/20 0613 05/05/20 0500  NA 139   < > 141 141 142 141 141  K 3.9   < > 4.1 4.1 4.3 4.3 4.7  CL 104   < > 104 105 104 104 103  CO2 23   < > 26 26 26 24 27   GLUCOSE 153*   < > 158* 149* 172* 185* 162*  BUN 29*   < > 34* 39* 34* 32* 36*  CREATININE 0.93   < > 0.95 1.05* 0.94 1.00 0.90  CALCIUM 8.0*   < > 8.1* 8.0* 8.2* 7.9* 8.1*  MG 2.2  --  2.3 2.5*  --   --   --   PHOS 2.6  --  2.5 3.4  --   --   --    < > = values in this interval not displayed.   Liver Function Tests: Recent Labs  Lab 04/30/20 0349 05/01/20 0559 05/02/20 0430 05/04/20 0613 05/05/20 0500  AST 73* 68* 53* 69* 38  ALT 38 40 42 44 35  ALKPHOS 78 102 135* 155* 137*  BILITOT 0.6 0.6 0.5 0.8 0.7  PROT 7.4 7.3 7.7 6.8 7.2  ALBUMIN 2.5* 2.4* 2.5* 2.2* 2.3*  No results for input(s): LIPASE, AMYLASE in the last 168 hours. No results for input(s): AMMONIA in the last 168 hours. Coagulation Profile: No results for input(s): INR, PROTIME in the last 168 hours. CBC: Recent Labs  Lab 04/29/20 0501 04/29/20 0501 04/30/20 0349 04/30/20 0349 05/01/20 0559 05/02/20 0430 05/03/20 0603 05/04/20 0613 05/05/20 0500  WBC 7.2   < > 11.8*   < > 12.8* 17.3* 21.2* 18.9* 18.5*  NEUTROABS 5.6  --  10.0*  --  10.6* 14.3*  --   --   --   HGB 13.4   < > 13.7   < > 13.8 14.6 13.7 13.8 14.5  HCT 42.6   < > 43.3   < > 44.0 46.8* 43.7 45.0 46.2*  MCV 91.4   < > 91.2   < > 91.9 92.7 92.2 93.0 92.4  PLT 193   < > 260   < > 254 219 247 302 368   < > = values in this interval not displayed.   Cardiac Enzymes: No results for input(s): CKTOTAL, CKMB, CKMBINDEX, TROPONINI in the last 168 hours. BNP: Invalid input(s): POCBNP CBG: No results for input(s): GLUCAP in the last 168 hours. HbA1C: No results for input(s): HGBA1C in the last 72 hours. Urine analysis: No results found for: COLORURINE, APPEARANCEUR, LABSPEC, PHURINE, GLUCOSEU, HGBUR, BILIRUBINUR, KETONESUR, PROTEINUR, UROBILINOGEN, NITRITE,  LEUKOCYTESUR Sepsis Labs: @LABRCNTIP (procalcitonin:4,lacticidven:4) ) Recent Results (from the past 240 hour(s))  SARS Coronavirus 2 by RT PCR (hospital order, [facility]) Nasopharyngeal Nasopharyngeal Swab     Status: Abnormal   Collection Time: 05/25/2020  7:20 PM   Specimen: Nasopharyngeal Swab  Result Value Ref Range Status   SARS Coronavirus 2 POSITIVE (A) NEGATIVE Final    Comment: RESULT CALLED TO, READ BACK BY AND VERIFIED WITH: T WALKER,RN@2149  05/25/2020 MKELLY (NOTE) SARS-CoV-2 target nucleic acids are DETECTED  SARS-CoV-2 RNA is generally detectable in upper respiratory specimens  during the acute phase of infection.  Positive results are indicative  of the presence of the identified virus, but do not rule out bacterial infection or co-infection with other pathogens not detected by the test.  Clinical correlation with patient history and  other diagnostic information is necessary to determine patient infection status.  The expected result is negative.  Fact Sheet for Patients:   BoilerBrush.com.cyhttps://www.fda.gov/media/136312/download   Fact Sheet for Healthcare Providers:   https://pope.com/https://www.fda.gov/media/136313/download    This test is not yet approved or cleared by the [place] FDA and  has been authorized for detection and/or diagnosis of SARS-CoV-2 by FDA under an Emergency Use Authorization (EUA).  This EUA will remain in effect (meaning this test  can be used) for the duration of  the COVID-19 declaration under Section 564(b)(1) of the Act, 21 U.S.C. section 360-bbb-3(b)(1), unless the authorization is terminated or revoked sooner.  Performed at [facility], [address] [place], [place] [address]   Blood Culture (routine x 2)     Status: None   Collection Time: 05/25/2020  7:48 PM   Specimen: BLOOD  Result Value Ref Range Status   Specimen Description BLOOD RIGHT ANTECUBITAL  Final   Special Requests   Final    BOTTLES DRAWN AEROBIC AND  ANAEROBIC Blood Culture adequate volume   Culture   Final    NO GROWTH 5 DAYS Performed at [facility], [address] [place], [place] [address]    Report Status 05/02/2020 FINAL  Final  Blood Culture (routine x 2)  Status: None   Collection Time: 2020-05-27  8:37 PM   Specimen: BLOOD LEFT HAND  Result Value Ref Range Status   Specimen Description BLOOD LEFT HAND  Final   Special Requests   Final    BOTTLES DRAWN AEROBIC AND ANAEROBIC Blood Culture adequate volume   Culture   Final    NO GROWTH 5 DAYS Performed at [facility], [address], [place], [place] [address]    Report Status 05/02/2020 FINAL  Final  MRSA PCR Screening     Status: None   Collection Time: 04/29/20 10:51 PM   Specimen: Nasal Mucosa; Nasopharyngeal  Result Value Ref Range Status   MRSA by PCR NEGATIVE NEGATIVE Final    Comment:        The GeneXpert MRSA Assay (FDA approved for NASAL specimens only), is one component of a comprehensive MRSA colonization surveillance program. It is not intended to diagnose MRSA infection nor to guide or monitor treatment for MRSA infections. Performed at [facility], [address], [place], [place] [address]      Scheduled Meds: . vitamin C  500 mg Oral Daily  . baricitinib  4 mg Oral Daily  . Chlorhexidine Gluconate Cloth  6 each Topical Daily  . dextromethorphan-guaiFENesin  1 tablet Oral BID  . enoxaparin (LOVENOX) injection  140 mg Subcutaneous Q12H  . feeding supplement (ENSURE ENLIVE)  237 mL Oral TID BM  . zinc sulfate  220 mg Oral Daily   Continuous Infusions: . methylPREDNISolone (SOLU-MEDROL) injection 160 mg (05/05/20 0940)    Procedures/Studies: DG Chest Port 1 View  Result Date: 2020/05/27 CLINICAL DATA:  Shortness of breath. EXAM: PORTABLE CHEST 1 VIEW COMPARISON:  None. FINDINGS: Mild cardiomegaly is noted. No pneumothorax is noted. Multiple small ill-defined opacities are noted throughout both lungs concerning for possible multifocal  pneumonia. Left pleural effusion cannot be excluded. Bony thorax is unremarkable. IMPRESSION: Multiple small ill-defined opacities are noted throughout both lungs concerning for possible multifocal pneumonia. Left pleural effusion cannot be excluded. Electronically Signed   By: Lupita Raider M.D.   On: May 27, 2020 19:42    Erick Blinks, MD  [facility]  If 7PM-7AM, please contact night-coverage [email or web address]  05/05/2020, 8:13 PM   LOS: 8 days

## 2020-05-05 NOTE — TOC Initial Note (Signed)
Transition of Care [facility]) - Initial/Assessment Note   Patient Details  Name: Lisa Diaz MRN: [id number] Date of Birth: [birth year]  Transition of Care [facility]) CM/SW Contact:    Ewing Schlein, LCSW Phone Number: 05/05/2020, 4:14 PM  Clinical Narrative: Patient is a 59 year old female who was admitted to pneumonia due to COVID-19. CSW notified patient's sister, [name], was requesting call from [facility]. CSW spoke with sister who requested that CSW refer patient to the financial counselor to determine if she is eligible for Medicaid. CSW made referral to financial counselor, Jerene Dilling. TOC to follow for discharge needs.                 Expected Discharge Plan: Home/Self Care Barriers to Discharge: Continued Medical Work up, Inadequate or no insurance  Expected Discharge Plan and Services Expected Discharge Plan: Home/Self Care In-house Referral: Clinical Social Work, Artist Living arrangements for the past 2 months: Single Family Home  Prior Living Arrangements/Services Living arrangements for the past 2 months: Single Family Home Patient language and need for interpreter reviewed:: Yes Do you feel safe going back to the place where you live?: Yes      Need for Family Participation in Patient Care: Yes (Comment) Care giver support system in place?: Yes (comment) Criminal Activity/Legal Involvement Pertinent to Current Situation/Hospitalization: No - Comment as needed  Activities of Daily Living Home Assistive Devices/Equipment: None ADL Screening (condition at time of admission) Patient's cognitive ability adequate to safely complete daily activities?: Yes Is the patient deaf or have difficulty hearing?: No Does the patient have difficulty seeing, even when wearing glasses/contacts?: No Does the patient have difficulty concentrating, remembering, or making decisions?: No Patient able to express need for assistance with ADLs?: Yes Does the patient have difficulty  dressing or bathing?: Yes Independently performs ADLs?: No Communication: Independent Dressing (OT): Needs assistance Is this a change from baseline?: Pre-admission baseline Grooming: Needs assistance Is this a change from baseline?: Pre-admission baseline Feeding: Independent Bathing: Needs assistance Is this a change from baseline?: Pre-admission baseline Toileting: Needs assistance Is this a change from baseline?: Pre-admission baseline In/Out Bed: Needs assistance Is this a change from baseline?: Pre-admission baseline Walks in Home: Independent Does the patient have difficulty walking or climbing stairs?: Yes Weakness of Legs: Both Weakness of Arms/Hands: None  Emotional Assessment Orientation: : Oriented to Self, Oriented to Place, Oriented to Situation Alcohol / Substance Use: Not Applicable Psych Involvement: No (comment)  Admission diagnosis:  Acute respiratory failure with hypoxia (HCC) [J96.01] Acute respiratory failure due to COVID-19 (HCC) [U07.1, J96.00] COVID-19 virus infection [U07.1] Pneumonia due to COVID-19 virus [U07.1, J12.82] Patient Active Problem List   Diagnosis Date Noted  . Palliative care by specialist   . Goals of care, counseling/discussion   . DNR (do not resuscitate) 04/29/2020  . Obesity, Class III, BMI 40-49.9 (morbid obesity) (HCC) 04/28/2020  . Acute respiratory failure due to COVID-19 (HCC) 04/28/2020  . Pneumonia due to COVID-19 virus 04/26/2020  . Acute respiratory failure with hypoxia (HCC) 05/14/2020  . Arthritis 05/01/2020  . Super obese 04/17/2020  . Elevated serum creatinine 04/18/2020  . Elevated BUN 04/26/2020  . SIRS (systemic inflammatory response syndrome) (HCC) 05/06/2020  . Diarrhea 05/14/2020  . Elevated AST (SGOT) 04/15/2020  . Dehydration 04/19/2020   PCP:  Patient, No Pcp Per Pharmacy:   [facility] [phone] - [place], [place] - [address] AT [facility] [address] [place]  [address]  Phone: [phone] Fax: [phone]  Readmission Risk Interventions No flowsheet data found.

## 2020-05-05 NOTE — Progress Notes (Signed)
Initial Nutrition Assessment  DOCUMENTATION CODES:   Morbid obesity  INTERVENTION:  If patient is agreeable- recommend place NGT to support nutrition intake:  Initiate Pivot 1.5 @ 20 ml/hr via NGT and increase by 10 ml every 8 hours to goal rate of 65 ml/hr.    Tube feeding regimen provides 2340 kcal, 146 grams of protein, and 1170 ml of H2O.    Monitor magnesium, potassium, and phosphorus daily for at least 3 days, MD to replete as needed, as pt is at risk for refeeding syndrome given her poor oral intake >7 days and COVID- pneumonia.   If tube feeding not an option encourage: -Ensure Enlive po TID, each supplement provides 350 kcal and 20 grams of protein   -Magic cup daily with lunch and dinner   NUTRITION DIAGNOSIS:   Inadequate oral intake related to acute illness (COVID-19/pneumonia) as evidenced by estimated needs, meal completion < 25%.   GOAL:  Provide needs based on ASPEN/SCCM guidelines    MONITOR:  Supplement acceptance, PO intake, Labs, I & O's, Weight trends, TF tolerance   REASON FOR ASSESSMENT:   LOS    ASSESSMENT: Patient is a 59 yo female presented on 9/13-with poor oral intake, shortness of breath, generalized weakness. Acute respiratory failure related to COVID-19/ pneumonia.   Discussed meal intake with nursing and MD. Patient is not currently meeting estimated needs which have now increased as she is > 5 days in ICU with COVID. Recommend offer high calorie /high protein oral supplement TID. Consider enteral feeding if patient is amiable to have alternate source of nutrition support.  Per chart review: Pt intake of one meal since admission-25%. Palliative is following -pt is DNR/DNI but wishes full scope of treatment. Pt refusing BiPAP at times per nursing  Medications reviewed and include: Vitamin C, Zinc sulfate.  Drips: Methylprednisolone    Intake/Output Summary (Last 24 hours) at 05/05/2020 1502 Last data filed at 05/05/2020 0803 Gross  per 24 hour  Intake 103.78 ml  Output 1100 ml  Net -996.22 ml  9/13- Admit weight: 158.8 kg    Labs: BMP Latest Ref Rng & Units 05/05/2020 05/04/2020 05/02/2020  Glucose 70 - 99 mg/dL 889(V) 694(H) 038(U)  BUN 6 - 20 mg/dL 82(C) 00(L) 49(Z)  Creatinine 0.44 - 1.00 mg/dL 7.91 5.05 6.97  Sodium 135 - 145 mmol/L 141 141 142  Potassium 3.5 - 5.1 mmol/L 4.7 4.3 4.3  Chloride 98 - 111 mmol/L 103 104 104  CO2 22 - 32 mmol/L 27 24 26   Calcium 8.9 - 10.3 mg/dL 8.1(L) 7.9(L) 8.2(L)     NUTRITION - FOCUSED PHYSICAL EXAM: deferred  Diet Order:   Diet Order            Diet regular Room service appropriate? Yes; Fluid consistency: Thin  Diet effective now                 EDUCATION NEEDS:   Not appropriate for education at this time Skin:  Skin Assessment: Reviewed RN Assessment (2+ pitting edema BLE)  Last BM:  9/19  Height:   Ht Readings from Last 1 Encounters:  05/04/20 5\' 8"  (1.727 m)    Weight:   Wt Readings from Last 1 Encounters:  05/05/20 (!) 185.9 kg    Ideal Body Weight:   64 kg  BMI:  Body mass index is 62.32 kg/m.  Estimated Nutritional Needs:   Kcal:  2400 (adjusted bw) ICU > 5 days  Protein:  125-146 gr  Fluid:  >2400  ml daily   Royann Shivers MS,RD,CSG,LDN Pager: Loretha Stapler

## 2020-05-06 ENCOUNTER — Inpatient Hospital Stay (HOSPITAL_COMMUNITY): Payer: HRSA Program

## 2020-05-06 LAB — COMPREHENSIVE METABOLIC PANEL
ALT: 35 U/L (ref 0–44)
AST: 32 U/L (ref 15–41)
Albumin: 2.4 g/dL — ABNORMAL LOW (ref 3.5–5.0)
Alkaline Phosphatase: 123 U/L (ref 38–126)
Anion gap: 11 (ref 5–15)
BUN: 41 mg/dL — ABNORMAL HIGH (ref 6–20)
CO2: 31 mmol/L (ref 22–32)
Calcium: 8.1 mg/dL — ABNORMAL LOW (ref 8.9–10.3)
Chloride: 100 mmol/L (ref 98–111)
Creatinine, Ser: 1.03 mg/dL — ABNORMAL HIGH (ref 0.44–1.00)
GFR calc Af Amer: >60 mL/min (ref >60)
GFR calc non Af Amer: 59 mL/min — ABNORMAL LOW (ref >60)
Glucose, Bld: 161 mg/dL — ABNORMAL HIGH (ref 70–99)
Potassium: 4.9 mmol/L (ref 3.5–5.1)
Sodium: 142 mmol/L (ref 135–145)
Total Bilirubin: 0.7 mg/dL (ref 0.3–1.2)
Total Protein: 6.9 g/dL (ref 6.5–8.1)

## 2020-05-06 LAB — CBC
HCT: 45.6 % (ref 36.0–46.0)
Hemoglobin: 14.1 g/dL (ref 12.0–15.0)
MCH: 28.7 pg (ref 26.0–34.0)
MCHC: 30.9 g/dL (ref 30.0–36.0)
MCV: 92.7 fL (ref 80.0–100.0)
Platelets: 386 10*3/uL (ref 150–400)
RBC: 4.92 MIL/uL (ref 3.87–5.11)
RDW: 13.4 % (ref 11.5–15.5)
WBC: 18 10*3/uL — ABNORMAL HIGH (ref 4.0–10.5)
nRBC: 0.3 % — ABNORMAL HIGH (ref 0.0–0.2)

## 2020-05-06 LAB — C-REACTIVE PROTEIN: CRP: 1.3 mg/dL — ABNORMAL HIGH (ref <1.0)

## 2020-05-06 LAB — FERRITIN: Ferritin: 681 ng/mL — ABNORMAL HIGH (ref 11–307)

## 2020-05-06 LAB — HEPARIN ANTI-XA: Heparin LMW: 1.64 IU/mL

## 2020-05-06 LAB — D-DIMER, QUANTITATIVE: D-Dimer, Quant: 13.34 ug/mL-FEU — ABNORMAL HIGH (ref 0.00–0.50)

## 2020-05-06 MED ORDER — ENOXAPARIN SODIUM 100 MG/ML ~~LOC~~ SOLN
100.0000 mg | Freq: Two times a day (BID) | SUBCUTANEOUS | Status: DC
  Administered 2020-05-06 – 2020-05-07 (×3): 100 mg via SUBCUTANEOUS
  Filled 2020-05-06 (×3): qty 1

## 2020-05-06 NOTE — Progress Notes (Signed)
ANTICOAGULATION CONSULT NOTE -  Pharmacy Consult for Lovenox Indication: VTE tx  Allergies  Allergen Reactions  . Penicillins     Did it involve swelling of the face/tongue/throat, SOB, or low BP? yes Did it involve sudden or severe rash/hives, skin peeling, or any reaction on the inside of your mouth or nose? yes Did you need to seek medical attention at a hospital or doctor's office? no When did it last happen?"years ago" If all above answers are "NO", may proceed with cephalosporin use.     Patient Measurements: Height: 5\' 8"  (172.7 cm) Weight: (!) 185.9 kg (409 lb 13.4 oz) IBW/kg (Calculated) : 63.9 BMI 63%  Vital Signs: Temp: 97.8 F (36.6 C) (09/22 0800) Temp Source: Axillary (09/22 0800) BP: 139/76 (09/22 1400) Pulse Rate: 107 (09/22 1100)  Labs: Recent Labs    05/04/20 05/06/20 05/04/20 0613 05/05/20 0500 05/06/20 0348 05/06/20 1144  HGB 13.8   < > 14.5 14.1  --   HCT 45.0  --  46.2* 45.6  --   PLT 302  --  368 386  --   HEPRLOWMOCWT  --   --   --   --  1.64  CREATININE 1.00  --  0.90 1.03*  --    < > = values in this interval not displayed.    Estimated Creatinine Clearance: 104.6 mL/min (A) (by C-G formula based on SCr of 1.03 mg/dL (H)).   Medical History: Past Medical History:  Diagnosis Date  . Arthritis     Medications:  Medications Prior to Admission  Medication Sig Dispense Refill Last Dose  . acetaminophen (TYLENOL) 500 MG tablet Take 1,000 mg by mouth every 6 (six) hours as needed for mild pain or fever.   25-May-2020 at Unknown time  . ibuprofen (ADVIL) 200 MG tablet Take 800 mg by mouth every 6 (six) hours as needed for fever or moderate pain.   Past Week at Unknown time  . naproxen sodium (ALEVE) 220 MG tablet Take 220 mg by mouth daily as needed (pain/fever).   Past Week at Unknown time    Assessment: 59 year old female with no documented chronic medical problems presenting with 1 week history of nonproductive cough, poor oral  intake, shortness of breath, myalgias, and headache with associated loose stools.  The patient states that in the past 2 days her coughing and shortness of breath and generalized weakness had worsened.  In addition, she has lost her sense of smell. She was diagnosed with acute respiratory fx secondary to COVID-19 PNA. CRP 16.1, D-dimer increased to 20. With worsening breathing and increased D-Dimer, MD changed lovenox px to treatment regimen. Will dose at 0.75mg /kg/dose d/t morbid obesity.  Anti-Xa level 1.64- supratherapeutic  Goal of Therapy:  Anti-Xa level 0.6-1 units/ml 4hrs after LMWH dose given Monitor platelets by anticoagulation protocol: Yes   Plan:  Decrease lovenox to 100 mg subq q12h Monitor for S/S of bleeding, anti-xa levels at steady state  46, PharmD Clinical Pharmacist 05/06/2020 3:11 PM

## 2020-05-06 NOTE — Progress Notes (Signed)
PROGRESS NOTE  Lisa Diaz DXA:[id number] DOB: 09/18/1960 DOA: 05/12/2020 PCP: Patient, No Pcp Per  Brief History:  59 year old female with no documented chronic medical problems presenting with 1 week history of nonproductive cough, poor oral intake, shortness of breath, myalgias, and headache with associated loose stools.  The patient states that in the past 2 days her coughing and shortness of breath and generalized weakness had worsened.  In addition, she has lost her sense of smell.  The patient has been complaining of loose stools for the past 3 days.  She has had 3-4 loose bowel movements daily.  She denies any hematochezia but complains of some black stool.  She states that she has been taking Pepto-Bismol.  The patient has been having some subjective fevers and chills.  She has not been vaccinated against COVID-19.  She lives with her foster daughter who had some type of viral infection 2 weeks prior to the patient's admission.  The patient went to urgent care on 05/13/2020.  She was noted to have oxygen saturation in the 60s.  She was sent to emergency department for further evaluation. In the emergency department, the patient had temperature up to 102.9 F.  She was hemodynamically stable.  Oxygen saturation was 88-90% on a nonrebreather.  The patient was started on Solu-Medrol and remdesivir.  Assessment/Plan: Acute respiratory failure secondary to COVID-19 pneumonia. -Continue IV Solu-Medrol day 9 (planning to treat for a total of 12 days). -She completed remdesivir on 9/17 -Continue the use of Vitamin C and zinc -Continue incentive respirometer and prone positioning. -continue baricitinib day 8 -CRP 22.1>17.7>11.0>16.1>3.7>2.8 -Ferritin 1457>3,063>3160>1341>1220>755 -D-dimer 1.70>2.25>4.32>>20>>20>13.6 -PCT 0.27 -Personally reviewed chest x-ray--bilateral patchy infiltrates appreciated. -Personally reviewed EKG--sinus rhythm, no ST-T wave changes -currently  requiring heated high flow oxygen and NRB mask -Discussed possibility of intubation and patient refused and has made clear that she is DNR/DNI.  Wishes will be respected -Reviewed case with Dr. Craige Cotta and he agreed with current treatment.  -Since patient is DNR, there does not appear to be any further escalation of care to offer -can consider Bipap if patient's shortness of breath worsens, although this may be of limited benefit -with D dimer >20, Lovenox was changed to therapeutic dosing; moving forward will treat with oral Eliquis for 1 month given patient's high risk for micro emboli. -Follow-up lower extremity venous Dopplers -Prior to discharge we will attempt CTA of the chest to rule out PE. -At present, she does not appear to be in distress and is mentating appropriately -continue current treatments.  AKI -No prior values to compare -Presented with serum creatinine 1.36 -creatinine improved to 0.9 with hydration -Continue to follow renal function trend intermittently.  Diarrhea -improved and forming up as per patient reports -stool studies not sent since she has not had further diarrhea. -Denies abdominal pain, nausea or vomiting.  Morbid obesity -BMI 62 -At risk for increased morbidity and mortality from COVID-19 -lifestyle modification, portion control, low calorie diet and increase physical activity discussed with patient. -Once medically stabilized and discharge will benefit of following with bariatric clinic  Hyponatremia -resolved with IV fluids. -Continue to follow electrolytes intermittently  Goals of care -discussed goals of care in detail with patient -On multiple occasions, she affirmed that she would not want to receive CPR or to be placed on a ventilator -She understands that if her respiratory condition continues to deteriorate then she may not survive this illness -She also correctly states that  even with a ventilator, her recovery would not be certain -If she  continues to worsen, then " when it is my time, it is my time" -If the situation were to arise, she wished to focus on comfort. -She has identified her sister [name] as main point of contact, I am glad for [place] to update intermittently with plan of care. -palliative care following, continue current treatments  Status is: Inpatient  Remains inpatient appropriate because:IV treatments appropriate due to intensity of illness or inability to take PO   Dispo: The patient is from: Home              Anticipated d/c is to: Home              Anticipated d/c date is: > 3 days              Patient currently is not medically stable to d/c.   Family Communication:   No family at bedside; patient was fully updated by Dr. Kerry Hough 05/05/2020; during my visit today patient expressed that she will talk with her sister and provide today's updates.  Consultants:  none  Code Status: DNR, confirmed with patient  DVT Prophylaxis:   Therapeutic lovenox   Procedures: As Listed in Progress Note Above  Antibiotics: None   Subjective: No fever, continue to be significantly short of breath requiring only 15 L heated high flow nonrebreather mask; denies chest pain, no nausea, no vomiting.  Having difficulty speaking in full sentences.  Objective: Vitals:   05/06/20 1301 05/06/20 1400 05/06/20 1500 05/06/20 1639  BP: 139/72 139/76 129/77   Pulse:      Resp: (!) 26 (!) 32 (!) 26   Temp:    97.7 F (36.5 C)  TempSrc:    Oral  SpO2:  96%    Weight:      Height:        Intake/Output Summary (Last 24 hours) at 05/06/2020 1708 Last data filed at 05/06/2020 1300 Gross per 24 hour  Intake 440 ml  Output 2150 ml  Net -1710 ml   Weight change:   Exam: General exam: Alert, awake, oriented x 3; currently afebrile, no chest pain, no nausea, no vomiting.  Continues to be significantly short of breath and requiring over 15 L of high flow nonrebreather mask for oxygen supplementation.  Patient demonstrated  difficulty speaking in full sentences and tachypnea/short winded sensation with activity. Respiratory system: Fair air movement bilaterally; positive rhonchi, no wheezing.  No using accessory muscles or resting. Cardiovascular system:RRR. No murmurs, rubs, gallops. Gastrointestinal system: Abdomen is obese, nondistended, soft and nontender. No organomegaly or masses felt. Normal bowel sounds heard. Central nervous system: Alert and oriented. No focal neurological deficits. Extremities: No cyanosis or clubbing. Skin: No rashes, no petechiae. Psychiatry: Judgement and insight appear normal. Mood & affect appropriate.   Data Reviewed: I have personally reviewed following labs and imaging studies  Basic Metabolic Panel: Recent Labs  Lab 04/30/20 0349 04/30/20 0349 05/01/20 0559 05/02/20 0430 05/04/20 0613 05/05/20 0500 05/06/20 0348  NA 141   < > 141 142 141 141 142  K 4.1   < > 4.1 4.3 4.3 4.7 4.9  CL 104   < > 105 104 104 103 100  CO2 26   < > 26 26 24 27 31   GLUCOSE 158*   < > 149* 172* 185* 162* 161*  BUN 34*   < > 39* 34* 32* 36* 41*  CREATININE 0.95   < >  1.05* 0.94 1.00 0.90 1.03*  CALCIUM 8.1*   < > 8.0* 8.2* 7.9* 8.1* 8.1*  MG 2.3  --  2.5*  --   --   --   --   PHOS 2.5  --  3.4  --   --   --   --    < > = values in this interval not displayed.   Liver Function Tests: Recent Labs  Lab 05/01/20 0559 05/02/20 0430 05/04/20 0613 05/05/20 0500 05/06/20 0348  AST 68* 53* 69* 38 32  ALT 40 42 44 35 35  ALKPHOS 102 135* 155* 137* 123  BILITOT 0.6 0.5 0.8 0.7 0.7  PROT 7.3 7.7 6.8 7.2 6.9  ALBUMIN 2.4* 2.5* 2.2* 2.3* 2.4*   CBC: Recent Labs  Lab 04/30/20 0349 04/30/20 0349 05/01/20 0559 05/01/20 0559 05/02/20 0430 05/03/20 0603 05/04/20 0613 05/05/20 0500 05/06/20 0348  WBC 11.8*   < > 12.8*   < > 17.3* 21.2* 18.9* 18.5* 18.0*  NEUTROABS 10.0*  --  10.6*  --  14.3*  --   --   --   --   HGB 13.7   < > 13.8   < > 14.6 13.7 13.8 14.5 14.1  HCT 43.3   < > 44.0    < > 46.8* 43.7 45.0 46.2* 45.6  MCV 91.2   < > 91.9   < > 92.7 92.2 93.0 92.4 92.7  PLT 260   < > 254   < > 219 247 302 368 386   < > = values in this interval not displayed.   Sepsis Labs:  Recent Results (from the past 240 hour(s))  SARS Coronavirus 2 by RT PCR (hospital order, performed in [facility] hospital lab) Nasopharyngeal Nasopharyngeal Swab     Status: Abnormal   Collection Time: 04/28/2020  7:20 PM   Specimen: Nasopharyngeal Swab  Result Value Ref Range Status   SARS Coronavirus 2 POSITIVE (A) NEGATIVE Final    Comment: RESULT CALLED TO, READ BACK BY AND VERIFIED WITH: T WALKER,RN@[id number]  04/29/2020 MKELLY (NOTE) SARS-CoV-2 target nucleic acids are DETECTED  SARS-CoV-2 RNA is generally detectable in upper respiratory specimens  during the acute phase of infection.  Positive results are indicative  of the presence of the identified virus, but do not rule out bacterial infection or co-infection with other pathogens not detected by the test.  Clinical correlation with patient history and  other diagnostic information is necessary to determine patient infection status.  The expected result is negative.  Fact Sheet for Patients:   BoilerBrush.com.cy   Fact Sheet for Healthcare Providers:   https://pope.com/    This test is not yet approved or cleared by the [place] FDA and  has been authorized for detection and/or diagnosis of SARS-CoV-2 by FDA under an Emergency Use Authorization (EUA).  This EUA will remain in effect (meaning this test  can be used) for the duration of  the COVID-19 declaration under Section 564(b)(1) of the Act, 21 U.S.C. section 360-bbb-3(b)(1), unless the authorization is terminated or revoked sooner.  Performed at [facility], [address] [place], [place] [address]   Blood Culture (routine x 2)     Status: None   Collection Time: 04/15/2020  7:48 PM   Specimen: BLOOD  Result Value Ref Range  Status   Specimen Description BLOOD RIGHT ANTECUBITAL  Final   Special Requests   Final    BOTTLES DRAWN AEROBIC AND ANAEROBIC Blood Culture adequate volume   Culture  Final    NO GROWTH 5 DAYS Performed at [facility], [address] [place], [place] [address]    Report Status 05/02/2020 FINAL  Final  Blood Culture (routine x 2)     Status: None   Collection Time: 05/02/2020  8:37 PM   Specimen: BLOOD LEFT HAND  Result Value Ref Range Status   Specimen Description BLOOD LEFT HAND  Final   Special Requests   Final    BOTTLES DRAWN AEROBIC AND ANAEROBIC Blood Culture adequate volume   Culture   Final    NO GROWTH 5 DAYS Performed at [facility], [address] [place], [place] [address]    Report Status 05/02/2020 FINAL  Final  MRSA PCR Screening     Status: None   Collection Time: 04/29/20 10:51 PM   Specimen: Nasal Mucosa; Nasopharyngeal  Result Value Ref Range Status   MRSA by PCR NEGATIVE NEGATIVE Final    Comment:        The GeneXpert MRSA Assay (FDA approved for NASAL specimens only), is one component of a comprehensive MRSA colonization surveillance program. It is not intended to diagnose MRSA infection nor to guide or monitor treatment for MRSA infections. Performed at [facility], [address] [place], [place] [address]      Scheduled Meds: . vitamin C  500 mg Oral Daily  . baricitinib  4 mg Oral Daily  . Chlorhexidine Gluconate Cloth  6 each Topical Daily  . dextromethorphan-guaiFENesin  1 tablet Oral BID  . enoxaparin (LOVENOX) injection  100 mg Subcutaneous Q12H  . feeding supplement (ENSURE ENLIVE)  237 mL Oral TID BM  . zinc sulfate  220 mg Oral Daily   Continuous Infusions: . methylPREDNISolone (SOLU-MEDROL) injection 160 mg (05/06/20 0809)    Procedures/Studies: US Venous Img Lower Bilateral (DVT)  Result Date: 05/06/2020 CLINICAL DATA:  Positive D-dimer EXAM: BILATERAL LOWER EXTREMITY VENOUS DOPPLER ULTRASOUND TECHNIQUE: Gray-scale  sonography with graded compression, as well as color Doppler and duplex ultrasound were performed to evaluate the lower extremity deep venous systems from the level of the common femoral vein and including the common femoral, femoral, profunda femoral, popliteal and calf veins including the posterior tibial, peroneal and gastrocnemius veins when visible. The superficial great saphenous vein was also interrogated. Spectral Doppler was utilized to evaluate flow at rest and with distal augmentation maneuvers in the common femoral, femoral and popliteal veins. COMPARISON:  None. FINDINGS: Very limited exam because of obese body habitus. RIGHT LOWER EXTREMITY Common Femoral Vein: No evidence of thrombus. Normal compressibility, respiratory phasicity and response to augmentation. Saphenofemoral Junction: No evidence of thrombus. Normal compressibility and flow on color Doppler imaging. Profunda Femoral Vein: No evidence of thrombus. Normal compressibility and flow on color Doppler imaging. Femoral Vein: No evidence of thrombus. Normal compressibility, respiratory phasicity and response to augmentation. Popliteal Vein: No evidence of thrombus. Normal compressibility, respiratory phasicity and response to augmentation. Calf Veins: No evidence of thrombus. Normal compressibility and flow on color Doppler imaging. LEFT LOWER EXTREMITY Common Femoral Vein: No evidence of thrombus. Normal compressibility, respiratory phasicity and response to augmentation. Saphenofemoral Junction: No evidence of thrombus. Normal compressibility and flow on color Doppler imaging. Profunda Femoral Vein: No evidence of thrombus. Normal compressibility and flow on color Doppler imaging. Femoral Vein: No evidence of thrombus. Normal compressibility, respiratory phasicity and response to augmentation. Popliteal Vein: No evidence of thrombus. Normal compressibility, respiratory phasicity and response to augmentation. Calf Veins: No evidence of  thrombus. Normal compressibility and flow on  color Doppler imaging. IMPRESSION: Very limited exam because of body habitus. No significant occlusive or acute DVT visualized in either extremity. Electronically Signed   By: Judie Petit.  Shick M.D.   On: 05/06/2020 12:04   DG Chest Port 1 View  Result Date: 07-May-2020 CLINICAL DATA:  Shortness of breath. EXAM: PORTABLE CHEST 1 VIEW COMPARISON:  None. FINDINGS: Mild cardiomegaly is noted. No pneumothorax is noted. Multiple small ill-defined opacities are noted throughout both lungs concerning for possible multifocal pneumonia. Left pleural effusion cannot be excluded. Bony thorax is unremarkable. IMPRESSION: Multiple small ill-defined opacities are noted throughout both lungs concerning for possible multifocal pneumonia. Left pleural effusion cannot be excluded. Electronically Signed   By: Lupita Raider M.D.   On: 05/07/20 19:42    Vassie Loll, MD  [facility]  If 7PM-7AM, please contact night-coverage [email or web address]  05/06/2020, 5:08 PM   LOS: 9 days

## 2020-05-06 NOTE — Progress Notes (Signed)
Pt has sat up in the chair since 1200 today, and is still doing so at the moment. It is the first time the patient has gotten up since admission. Pt dangled on the side of the bed first,and then stood and walked approx. 10 feet with a walker to the chair. Pt has tolerated well and thoroughly enjoyed sitting up she stated. Will continue to monitor, and encourage to get up daily.

## 2020-05-06 NOTE — Progress Notes (Signed)
Pt stated she still wanted to sit up in chair upon evening rounds as it was very comfortable for her and it was good to get out of the bed. Will make night shift RN aware.

## 2020-05-07 ENCOUNTER — Other Ambulatory Visit: Payer: Self-pay

## 2020-05-07 DIAGNOSIS — R7989 Other specified abnormal findings of blood chemistry: Secondary | ICD-10-CM

## 2020-05-07 DIAGNOSIS — E669 Obesity, unspecified: Secondary | ICD-10-CM

## 2020-05-07 DIAGNOSIS — J9601 Acute respiratory failure with hypoxia: Secondary | ICD-10-CM

## 2020-05-07 DIAGNOSIS — R197 Diarrhea, unspecified: Secondary | ICD-10-CM

## 2020-05-07 DIAGNOSIS — E860 Dehydration: Secondary | ICD-10-CM

## 2020-05-07 LAB — COMPREHENSIVE METABOLIC PANEL
ALT: 36 U/L (ref 0–44)
AST: 33 U/L (ref 15–41)
Albumin: 2.4 g/dL — ABNORMAL LOW (ref 3.5–5.0)
Alkaline Phosphatase: 119 U/L (ref 38–126)
Anion gap: 10 (ref 5–15)
BUN: 43 mg/dL — ABNORMAL HIGH (ref 6–20)
CO2: 30 mmol/L (ref 22–32)
Calcium: 8.2 mg/dL — ABNORMAL LOW (ref 8.9–10.3)
Chloride: 101 mmol/L (ref 98–111)
Creatinine, Ser: 0.94 mg/dL (ref 0.44–1.00)
GFR calc Af Amer: >60 mL/min (ref >60)
GFR calc non Af Amer: >60 mL/min (ref >60)
Glucose, Bld: 164 mg/dL — ABNORMAL HIGH (ref 70–99)
Potassium: 4.8 mmol/L (ref 3.5–5.1)
Sodium: 141 mmol/L (ref 135–145)
Total Bilirubin: 0.5 mg/dL (ref 0.3–1.2)
Total Protein: 6.7 g/dL (ref 6.5–8.1)

## 2020-05-07 LAB — CBC
HCT: 46.4 % — ABNORMAL HIGH (ref 36.0–46.0)
Hemoglobin: 14.4 g/dL (ref 12.0–15.0)
MCH: 29.1 pg (ref 26.0–34.0)
MCHC: 31 g/dL (ref 30.0–36.0)
MCV: 93.9 fL (ref 80.0–100.0)
Platelets: 422 10*3/uL — ABNORMAL HIGH (ref 150–400)
RBC: 4.94 MIL/uL (ref 3.87–5.11)
RDW: 13.5 % (ref 11.5–15.5)
WBC: 22.5 10*3/uL — ABNORMAL HIGH (ref 4.0–10.5)
nRBC: 0.3 % — ABNORMAL HIGH (ref 0.0–0.2)

## 2020-05-07 LAB — FERRITIN: Ferritin: 570 ng/mL — ABNORMAL HIGH (ref 11–307)

## 2020-05-07 LAB — C-REACTIVE PROTEIN: CRP: 0.7 mg/dL (ref <1.0)

## 2020-05-07 LAB — D-DIMER, QUANTITATIVE: D-Dimer, Quant: 12.91 ug/mL-FEU — ABNORMAL HIGH (ref 0.00–0.50)

## 2020-05-07 NOTE — Progress Notes (Signed)
PROGRESS NOTE  Eliseo GumLillian T Steven ZOX:[id number] DOB: 03/14/1961 DOA: 05/06/2020 PCP: Patient, No Pcp Per  Brief History:  59 year old female with no documented chronic medical problems presenting with 1 week history of nonproductive cough, poor oral intake, shortness of breath, myalgias, and headache with associated loose stools.  The patient states that in the past 2 days her coughing and shortness of breath and generalized weakness had worsened.  In addition, she has lost her sense of smell.  The patient has been complaining of loose stools for the past 3 days.  She has had 3-4 loose bowel movements daily.  She denies any hematochezia but complains of some black stool.  She states that she has been taking Pepto-Bismol.  The patient has been having some subjective fevers and chills.  She has not been vaccinated against COVID-19.  She lives with her foster daughter who had some type of viral infection 2 weeks prior to the patient's admission.  The patient went to urgent care on 05/07/2020.  She was noted to have oxygen saturation in the 60s.  She was sent to emergency department for further evaluation. In the emergency department, the patient had temperature up to 102.9 F.  She was hemodynamically stable.  Oxygen saturation was 88-90% on a nonrebreather.  The patient was started on Solu-Medrol and remdesivir.  Assessment/Plan: Acute respiratory failure secondary to COVID-19 pneumonia. -Continue IV Solu-Medrol day 10  -She completed remdesivir on 9/17 -Continue the use of Vitamin C and zinc -Continue incentive respirometer and prone positioning. -continue baricitinib day 10 (planning, for a 14 days) -CRP 22.1>17.7>11.0>16.1>3.7>2.8 -Ferritin 1457>3,063>3160>1341>1220>755 -D-dimer 1.70>2.25>4.32>>20>>20>13.6 -PCT 0.27 -Personally reviewed chest x-ray--bilateral patchy infiltrates appreciated. -Personally reviewed EKG--sinus rhythm, no ST-T wave changes -currently requiring heated high  flow oxygen and NRB mask -Discussed possibility of intubation and patient refused and has made clear that she is DNR/DNI.  Wishes will be respected -Reviewed case with Dr. Craige CottaSood and he agreed with current treatment.  -Since patient is DNR, there does not appear to be any further escalation of care to offer -can consider Bipap if patient's shortness of breath worsens, although this may be of limited benefit -with D dimer >20, Lovenox was changed to therapeutic dosing; moving forward will treat with oral Eliquis for 1 month given patient's high risk for micro emboli. -Follow-up lower extremity venous Dopplers -Prior to discharge we will attempt CTA of the chest to rule out PE. -At present, she does not appear to be in distress and is mentating appropriately -continue current treatments.  AKI -No prior values to compare -Presented with serum creatinine 1.36 -creatinine improved to 0.9 with hydration -Continue to follow renal function trend intermittently.  Diarrhea -improved and forming up as per patient reports -stool studies not sent since she has not had further diarrhea. -Denies abdominal pain, nausea or vomiting.  Morbid obesity -BMI 62 -At risk for increased morbidity and mortality from COVID-19 -lifestyle modification, portion control, low calorie diet and increase physical activity discussed with patient. -Once medically stabilized and discharge will benefit of following with bariatric clinic  Hyponatremia -resolved with IV fluids. -Continue to follow electrolytes intermittently  Goals of care -discussed goals of care in detail with patient -On multiple occasions, she affirmed that she would not want to receive CPR or to be placed on a ventilator -She understands that if her respiratory condition continues to deteriorate then she may not survive this illness -She also correctly states that even with a  ventilator, her recovery would not be certain -If she continues to worsen,  then " when it is my time, it is my time" -If the situation were to arise, she wished to focus on comfort. -She has identified her sister [name] as main point of contact, I am glad for [place] to update intermittently with plan of care. -palliative care following, continue current treatments  Status is: Inpatient  Remains inpatient appropriate because:IV treatments appropriate due to intensity of illness or inability to take PO   Dispo: The patient is from: Home              Anticipated d/c is to: Home              Anticipated d/c date is: > 3 days              Patient currently is not medically stable to d/c.   Family Communication:   No family at bedside. Sister updated over the phone on 05/07/20  Consultants:  none  Code Status: DNR, confirmed with patient  DVT Prophylaxis:   Therapeutic lovenox   Procedures: As Listed in Progress Note Above  Antibiotics: None   Subjective: Afebrile, reports no chest pain, no nausea or vomiting.  Still short of breath and tachypneic with minimal activity, and requiring high flow oxygen supplementation (nonrebreather over 15 L).  Patient feels that she is slowly improving and getting better.  Follow instructions by nursing staff and participating with her care.  Objective: Vitals:   05/07/20 0300 05/07/20 0400 05/07/20 0500 05/07/20 0600  BP: (!) 135/93 (!) 147/98 (!) 158/79 (!) 136/95  Pulse:      Resp: (!) 25 (!) 22 (!) 30 (!) 31  Temp:  98.5 F (36.9 C)    TempSrc:  Axillary    SpO2:      Weight:      Height:        Intake/Output Summary (Last 24 hours) at 05/07/2020 1609 Last data filed at 05/07/2020 0500 Gross per 24 hour  Intake 600 ml  Output 1450 ml  Net -850 ml   Weight change:   Exam: General exam: Alert, awake, oriented x 3; continued to be short of breath, requiring 15 L of high flow nonrebreather mask oxygen supplementation; but expressing slight improvement in her respiratory status.  Sitting up in bedside chair  and denying chest pain, nausea, vomiting, fever or any other complaints. Respiratory system: Fair air movement bilaterally, positive scattered rhonchi, no wheezing, no using accessory muscle. Cardiovascular system:RRR. No murmurs, rubs, gallops.  Unable to properly assess JVD. Gastrointestinal system: Abdomen is obese, nondistended, soft and nontender. No organomegaly or masses felt. Normal bowel sounds heard. Central nervous system: Alert and oriented. No focal neurological deficits. Extremities: No cyanosis or clubbing. Skin: No rashes, no petechiae. Psychiatry: Judgement and insight appear normal. Mood & affect appropriate.     Data Reviewed: I have personally reviewed following labs and imaging studies  Basic Metabolic Panel: Recent Labs  Lab 05/01/20 0559 05/01/20 0559 05/02/20 0430 05/04/20 0613 05/05/20 0500 05/06/20 0348 05/07/20 0440  NA 141   < > 142 141 141 142 141  K 4.1   < > 4.3 4.3 4.7 4.9 4.8  CL 105   < > 104 104 103 100 101  CO2 26   < > GLUCOSE 149*   < > 172* 185* 162* 161* 164*  BUN 39*   < > 34* 32* 36* 41* 43*  CREATININE 1.05*   < > 0.94 1.00 0.90 1.03* 0.94  CALCIUM 8.0*   < > 8.2* 7.9* 8.1* 8.1* 8.2*  MG 2.5*  --   --   --   --   --   --   PHOS 3.4  --   --   --   --   --   --    < > = values in this interval not displayed.   Liver Function Tests: Recent Labs  Lab 05/02/20 0430 05/04/20 0613 05/05/20 0500 05/06/20 0348 05/07/20 0440  AST 53* 69* 38 32 33  ALT 42 44 35 35 36  ALKPHOS 135* 155* 137* 123 119  BILITOT 0.5 0.8 0.7 0.7 0.5  PROT 7.7 6.8 7.2 6.9 6.7  ALBUMIN 2.5* 2.2* 2.3* 2.4* 2.4*   CBC: Recent Labs  Lab 05/01/20 0559 05/01/20 0559 05/02/20 0430 05/02/20 0430 05/03/20 0603 05/04/20 0613 05/05/20 0500 05/06/20 0348 05/07/20 0440  WBC 12.8*   < > 17.3*   < > 21.2* 18.9* 18.5* 18.0* 22.5*  NEUTROABS 10.6*  --  14.3*  --   --   --   --   --   --   HGB 13.8   < > 14.6   < > 13.7 13.8 14.5 14.1 14.4  HCT  44.0   < > 46.8*   < > 43.7 45.0 46.2* 45.6 46.4*  MCV 91.9   < > 92.7   < > 92.2 93.0 92.4 92.7 93.9  PLT 254   < > 219   < > 247 302 368 386 422*   < > = values in this interval not displayed.   Sepsis Labs:  Recent Results (from the past 240 hour(s))  SARS Coronavirus 2 by RT PCR (hospital order, performed in [facility] hospital lab) Nasopharyngeal Nasopharyngeal Swab     Status: Abnormal   Collection Time: 05/01/2020  7:20 PM   Specimen: Nasopharyngeal Swab  Result Value Ref Range Status   SARS Coronavirus 2 POSITIVE (A) NEGATIVE Final    Comment: RESULT CALLED TO, READ BACK BY AND VERIFIED WITH: T WALKER,RN@[id number]  04/24/2020 MKELLY (NOTE) SARS-CoV-2 target nucleic acids are DETECTED  SARS-CoV-2 RNA is generally detectable in upper respiratory specimens  during the acute phase of infection.  Positive results are indicative  of the presence of the identified virus, but do not rule out bacterial infection or co-infection with other pathogens not detected by the test.  Clinical correlation with patient history and  other diagnostic information is necessary to determine patient infection status.  The expected result is negative.  Fact Sheet for Patients:   BoilerBrush.com.cy   Fact Sheet for Healthcare Providers:   https://pope.com/    This test is not yet approved or cleared by the [place] FDA and  has been authorized for detection and/or diagnosis of SARS-CoV-2 by FDA under an Emergency Use Authorization (EUA).  This EUA will remain in effect (meaning this test  can be used) for the duration of  the COVID-19 declaration under Section 564(b)(1) of the Act, 21 U.S.C. section 360-bbb-3(b)(1), unless the authorization is terminated or revoked sooner.  Performed at [facility], [address], [place], [place] [address]   Blood Culture (routine x 2)     Status: None   Collection Time: 04/24/2020  7:48 PM   Specimen: BLOOD  Result  Value Ref Range Status   Specimen Description BLOOD RIGHT ANTECUBITAL  Final   Special Requests   Final    BOTTLES  DRAWN AEROBIC AND ANAEROBIC Blood Culture adequate volume   Culture   Final    NO GROWTH 5 DAYS Performed at [facility], [address] [place], [place] [address]    Report Status 05/02/2020 FINAL  Final  Blood Culture (routine x 2)     Status: None   Collection Time: 04/16/2020  8:37 PM   Specimen: BLOOD LEFT HAND  Result Value Ref Range Status   Specimen Description BLOOD LEFT HAND  Final   Special Requests   Final    BOTTLES DRAWN AEROBIC AND ANAEROBIC Blood Culture adequate volume   Culture   Final    NO GROWTH 5 DAYS Performed at [facility], [address], [place], [place] [address]    Report Status 05/02/2020 FINAL  Final  MRSA PCR Screening     Status: None   Collection Time: 04/29/20 10:51 PM   Specimen: Nasal Mucosa; Nasopharyngeal  Result Value Ref Range Status   MRSA by PCR NEGATIVE NEGATIVE Final    Comment:        The GeneXpert MRSA Assay (FDA approved for NASAL specimens only), is one component of a comprehensive MRSA colonization surveillance program. It is not intended to diagnose MRSA infection nor to guide or monitor treatment for MRSA infections. Performed at [facility], [address] [place], [place] [address]      Scheduled Meds: . vitamin C  500 mg Oral Daily  . baricitinib  4 mg Oral Daily  . Chlorhexidine Gluconate Cloth  6 each Topical Daily  . dextromethorphan-guaiFENesin  1 tablet Oral BID  . enoxaparin (LOVENOX) injection  100 mg Subcutaneous Q12H  . feeding supplement (ENSURE ENLIVE)  237 mL Oral TID BM  . zinc sulfate  220 mg Oral Daily   Continuous Infusions: . methylPREDNISolone (SOLU-MEDROL) injection 160 mg (05/07/20 1000)    Procedures/Studies: US Venous Img Lower Bilateral (DVT)  Result Date: 05/06/2020 CLINICAL DATA:  Positive D-dimer EXAM: BILATERAL LOWER EXTREMITY VENOUS DOPPLER ULTRASOUND TECHNIQUE:  Gray-scale sonography with graded compression, as well as color Doppler and duplex ultrasound were performed to evaluate the lower extremity deep venous systems from the level of the common femoral vein and including the common femoral, femoral, profunda femoral, popliteal and calf veins including the posterior tibial, peroneal and gastrocnemius veins when visible. The superficial great saphenous vein was also interrogated. Spectral Doppler was utilized to evaluate flow at rest and with distal augmentation maneuvers in the common femoral, femoral and popliteal veins. COMPARISON:  None. FINDINGS: Very limited exam because of obese body habitus. RIGHT LOWER EXTREMITY Common Femoral Vein: No evidence of thrombus. Normal compressibility, respiratory phasicity and response to augmentation. Saphenofemoral Junction: No evidence of thrombus. Normal compressibility and flow on color Doppler imaging. Profunda Femoral Vein: No evidence of thrombus. Normal compressibility and flow on color Doppler imaging. Femoral Vein: No evidence of thrombus. Normal compressibility, respiratory phasicity and response to augmentation. Popliteal Vein: No evidence of thrombus. Normal compressibility, respiratory phasicity and response to augmentation. Calf Veins: No evidence of thrombus. Normal compressibility and flow on color Doppler imaging. LEFT LOWER EXTREMITY Common Femoral Vein: No evidence of thrombus. Normal compressibility, respiratory phasicity and response to augmentation. Saphenofemoral Junction: No evidence of thrombus. Normal compressibility and flow on color Doppler imaging. Profunda Femoral Vein: No evidence of thrombus. Normal compressibility and flow on color Doppler imaging. Femoral Vein: No evidence of thrombus. Normal compressibility, respiratory phasicity and response to augmentation. Popliteal Vein: No evidence of thrombus. Normal compressibility, respiratory phasicity and response to  augmentation. Calf Veins: No evidence  of thrombus. Normal compressibility and flow on color Doppler imaging. IMPRESSION: Very limited exam because of body habitus. No significant occlusive or acute DVT visualized in either extremity. Electronically Signed   By: Judie Petit.  Shick M.D.   On: 05/06/2020 12:04   DG Chest Port 1 View  Result Date: 04/16/2020 CLINICAL DATA:  Shortness of breath. EXAM: PORTABLE CHEST 1 VIEW COMPARISON:  None. FINDINGS: Mild cardiomegaly is noted. No pneumothorax is noted. Multiple small ill-defined opacities are noted throughout both lungs concerning for possible multifocal pneumonia. Left pleural effusion cannot be excluded. Bony thorax is unremarkable. IMPRESSION: Multiple small ill-defined opacities are noted throughout both lungs concerning for possible multifocal pneumonia. Left pleural effusion cannot be excluded. Electronically Signed   By: Lupita Raider M.D.   On: 04/17/2020 19:42    Vassie Loll, MD  [facility]  If 7PM-7AM, please contact night-coverage [email or web address]  05/07/2020, 4:09 PM   LOS: 10 days

## 2020-05-08 LAB — HEPARIN ANTI-XA: Heparin LMW: 1.79 IU/mL

## 2020-05-08 LAB — COMPREHENSIVE METABOLIC PANEL
ALT: 35 U/L (ref 0–44)
AST: 26 U/L (ref 15–41)
Albumin: 2.3 g/dL — ABNORMAL LOW (ref 3.5–5.0)
Alkaline Phosphatase: 113 U/L (ref 38–126)
Anion gap: 9 (ref 5–15)
BUN: 40 mg/dL — ABNORMAL HIGH (ref 6–20)
CO2: 28 mmol/L (ref 22–32)
Calcium: 8.2 mg/dL — ABNORMAL LOW (ref 8.9–10.3)
Chloride: 102 mmol/L (ref 98–111)
Creatinine, Ser: 0.86 mg/dL (ref 0.44–1.00)
GFR calc Af Amer: >60 mL/min (ref >60)
GFR calc non Af Amer: >60 mL/min (ref >60)
Glucose, Bld: 185 mg/dL — ABNORMAL HIGH (ref 70–99)
Potassium: 5 mmol/L (ref 3.5–5.1)
Sodium: 139 mmol/L (ref 135–145)
Total Bilirubin: 0.6 mg/dL (ref 0.3–1.2)
Total Protein: 6.3 g/dL — ABNORMAL LOW (ref 6.5–8.1)

## 2020-05-08 LAB — BLOOD GAS, ARTERIAL
Acid-Base Excess: 4.9 mmol/L — ABNORMAL HIGH (ref 0.0–2.0)
Bicarbonate: 28.3 mmol/L — ABNORMAL HIGH (ref 20.0–28.0)
FIO2: 100
O2 Saturation: 92.6 %
Patient temperature: 36.5
pCO2 arterial: 43.2 mmHg (ref 32.0–48.0)
pH, Arterial: 7.44 (ref 7.350–7.450)
pO2, Arterial: 66.4 mmHg — ABNORMAL LOW (ref 83.0–108.0)

## 2020-05-08 LAB — CBC
HCT: 44.7 % (ref 36.0–46.0)
Hemoglobin: 13.8 g/dL (ref 12.0–15.0)
MCH: 28.6 pg (ref 26.0–34.0)
MCHC: 30.9 g/dL (ref 30.0–36.0)
MCV: 92.5 fL (ref 80.0–100.0)
Platelets: 408 10*3/uL — ABNORMAL HIGH (ref 150–400)
RBC: 4.83 MIL/uL (ref 3.87–5.11)
RDW: 13.3 % (ref 11.5–15.5)
WBC: 20.8 10*3/uL — ABNORMAL HIGH (ref 4.0–10.5)
nRBC: 0.1 % (ref 0.0–0.2)

## 2020-05-08 LAB — C-REACTIVE PROTEIN: CRP: 0.6 mg/dL (ref <1.0)

## 2020-05-08 LAB — D-DIMER, QUANTITATIVE: D-Dimer, Quant: 8.12 ug/mL-FEU — ABNORMAL HIGH (ref 0.00–0.50)

## 2020-05-08 LAB — FERRITIN: Ferritin: 568 ng/mL — ABNORMAL HIGH (ref 11–307)

## 2020-05-08 MED ORDER — PREDNISONE 20 MG PO TABS
50.0000 mg | ORAL_TABLET | Freq: Every day | ORAL | Status: AC
  Administered 2020-05-09 – 2020-05-12 (×4): 50 mg via ORAL
  Filled 2020-05-08 (×4): qty 1

## 2020-05-08 MED ORDER — ENOXAPARIN SODIUM 80 MG/0.8ML ~~LOC~~ SOLN: 80.0000 mg | Freq: Two times a day (BID) | SUBCUTANEOUS | Status: DC

## 2020-05-08 MED ORDER — ENOXAPARIN SODIUM 80 MG/0.8ML ~~LOC~~ SOLN: 70.0000 mg | Freq: Two times a day (BID) | SUBCUTANEOUS | Status: DC

## 2020-05-08 MED ORDER — ENOXAPARIN SODIUM 80 MG/0.8ML ~~LOC~~ SOLN
70.0000 mg | Freq: Two times a day (BID) | SUBCUTANEOUS | Status: DC
  Administered 2020-05-08 – 2020-05-12 (×8): 70 mg via SUBCUTANEOUS
  Filled 2020-05-08 (×7): qty 0.8

## 2020-05-08 NOTE — Progress Notes (Signed)
ANTICOAGULATION CONSULT NOTE -  Pharmacy Consult for Lovenox Indication: VTE tx  Allergies  Allergen Reactions  . Penicillins     Did it involve swelling of the face/tongue/throat, SOB, or low BP? yes Did it involve sudden or severe rash/hives, skin peeling, or any reaction on the inside of your mouth or nose? yes Did you need to seek medical attention at a hospital or doctor's office? no When did it last happen?"years ago" If all above answers are "NO", may proceed with cephalosporin use.     Patient Measurements: Height: 5\' 8"  (172.7 cm) Weight: (!) 185.9 kg (409 lb 13.4 oz) IBW/kg (Calculated) : 63.9 BMI 63%  Vital Signs: Temp: 98.1 F (36.7 C) (09/24 0400) Temp Source: Axillary (09/24 0400) BP: 134/65 (09/24 0700) Pulse Rate: 78 (09/23 2054)  Labs: Recent Labs    05/06/20 0348 05/06/20 0348 05/06/20 1144 05/07/20 0440 05/08/20 0344  HGB 14.1   < >  --  14.4 13.8  HCT 45.6  --   --  46.4* 44.7  PLT 386  --   --  422* 408*  HEPRLOWMOCWT  --   --  1.64  --  1.79  CREATININE 1.03*  --   --  0.94 0.86   < > = values in this interval not displayed.    Estimated Creatinine Clearance: 125.3 mL/min (by C-G formula based on SCr of 0.86 mg/dL).     Assessment: 59 year old female with no documented chronic medical problems presenting with 1 week history of nonproductive cough, poor oral intake, shortness of breath, myalgias, and headache with associated loose stools.  The patient states that in the past 2 days her coughing and shortness of breath and generalized weakness had worsened.  In addition, she has lost her sense of smell. She was diagnosed with acute respiratory fx secondary to COVID-19 PNA. CRP 16.1, D-dimer increased to 20. With worsening breathing and increased D-Dimer, MD changed lovenox px to treatment regimen. Will dose at 0.75mg /kg/dose d/t morbid obesity.   9/24 0745 Update  Anti-Xa level: 1.79 -remains supra-therapeutic CBC: remains WNL D-Dimer:  8.12ug/mL-FEU ( trending down )  Goal of Therapy:  Anti-Xa level 0.6-1 units/ml 4hrs after LMWH dose given Monitor platelets by anticoagulation protocol: Yes   Plan:  Decrease lovenox to 70 mg subq q12h Monitor for S/S of bleeding, anti-xa levels at steady state  10/24, [place]. D. Clinical Pharmacist 05/08/2020 7:48 AM

## 2020-05-08 NOTE — Progress Notes (Signed)
PROGRESS NOTE  Lisa Diaz:[id number] DOB: [birth year] DOA: 04/26/2020 PCP: Patient, No Pcp Per  Brief History:  59 year old female with no documented chronic medical problems presenting with 1 week history of nonproductive cough, poor oral intake, shortness of breath, myalgias, and headache with associated loose stools.  The patient states that in the past 2 days her coughing and shortness of breath and generalized weakness had worsened.  In addition, she has lost her sense of smell.  The patient has been complaining of loose stools for the past 3 days.  She has had 3-4 loose bowel movements daily.  She denies any hematochezia but complains of some black stool.  She states that she has been taking Pepto-Bismol.  The patient has been having some subjective fevers and chills.  She has not been vaccinated against COVID-19.  She lives with her foster daughter who had some type of viral infection 2 weeks prior to the patient's admission.  The patient went to urgent care on 04/26/2020.  She was noted to have oxygen saturation in the 60s.  She was sent to emergency department for further evaluation. In the emergency department, the patient had temperature up to 102.9 F.  She was hemodynamically stable.  Oxygen saturation was 88-90% on a nonrebreather.  The patient was started on Solu-Medrol and remdesivir.   Assessment/Plan: Acute respiratory failure secondary to COVID-19 pneumonia. -Completed IV Solu-Medrol x 10 days -Prednisone for 4 days started on 05/08/2020 -She completed remdesivir on 9/17 -Continue the use of Vitamin C and zinc -Continue incentive respirometer -Patient unable to do positioning, advised to avoid laying on her back, she will try and lay on her side -continue baricitinib day 11 (planning, for a 14 days) -CRP 22.1>17.7>11.0>16.1>3.7>2.8 -Ferritin 1457>3,063>3160>1341>1220>755 -D-dimer 1.70>2.25>4.32>>20>>20>13.6 -PCT 0.27 -Severe/profound hypoxia persist -ABG  done on 05/08/2020 was done on a combination of HFNC AND NRB-and PO2 was 66 -Plan of care, advanced directives and goals of care discussed with patient patient request no intubation, patient request DNR/DNI, patient remains full scope of treatment otherwise -can consider Bipap if patient's shortness of breath worsens, although this may be of limited benefit -with D dimer >20, Lovenox was changed to therapeutic dosing; moving forward will treat for 1 month given patient's high risk for micro emboli. -Follow-up lower extremity venous Dopplers-initial Doppler studies were poor quality due to patient's body habitus -Prior to discharge we will attempt CTA of the chest to rule out PE.---Patient is too hypoxic and too medically unstable at this time to lie flat for CTA chest   AKI -No prior values to compare -Presented with serum creatinine 1.36 -AKI resolved with hydration   Morbid obesity -BMI 62 -At risk for increased morbidity and mortality from COVID-19 -lifestyle modification, portion control, low calorie diet and increase physical activity discussed with patient. -Once medically stabilized and discharge will benefit of following with bariatric clinic   Social/Ethics- --Plan of care, advanced directives and goals of care discussed with patient patient request no intubation, patient request DNR/DNI, patient remains full scope of treatment otherwise -On multiple occasions, she affirmed that she would not want to receive CPR or to be placed on a ventilator -She understands that if her respiratory condition continues to deteriorate then she may not survive this illness -She also correctly states that even with a ventilator, her recovery would not be certain -If she continues to worsen, then " when it is my time, it is my time" -If the  situation were to arise, she wished to focus on comfort. -She has identified her sister [name] as main point of contact, I am glad for [place] to update  intermittently with plan of care. -palliative care following, continue current treatments  Status is: Inpatient  Remains inpatient appropriate because:IV treatments appropriate due to intensity of illness or inability to take PO   Dispo: The patient is from: Home              Anticipated d/c is to: Home              Anticipated d/c date is: > 3 days              Patient currently is not medically stable to d/c.   Family Communication:   No family at bedside. Sister updated over the phone on 05/07/20  Consultants:  none  Code Status: DNR, confirmed with patient  DVT Prophylaxis:   Therapeutic lovenox   Procedures: As Listed in Progress Note Above  Antibiotics: None   Subjective:  -Cough, shortness of breath and severe hypoxia persist -Vomiting or diarrhea  Objective: Vitals:   05/08/20 1300 05/08/20 1400 05/08/20 1500 05/08/20 1600  BP: (!) 151/89 110/70 (!) 117/58 124/70  Pulse: 95  87 76  Resp: (!) 21 (!) 27 (!) 27 (!) 22  Temp:      TempSrc:      SpO2: 95%  92% 98%  Weight:      Height:        Intake/Output Summary (Last 24 hours) at 05/08/2020 1738 Last data filed at 05/08/2020 1100 Gross per 24 hour  Intake 186.52 ml  Output 600 ml  Net -413.48 ml   Weight change:   Exam: General exam:  -Billy obese, conversational dyspnea HEENT- HFNC And NRB Respiratory system: Air movement is fair, scattered rhonchi noted, no wheezing  cardiovascular system:RRR. No murmurs, rubs, gallops.  Gastrointestinal system: +BS, increased truncal adiposity, NT, ND Central nervous system: Generalized weakness, alert and oriented. No focal neurological deficits. Extremities: No cyanosis or clubbing. Skin: No rashes, no petechiae. Psychiatry: Judgement and insight appear normal. Mood & affect appropriate.     Data Reviewed: I have personally reviewed following labs and imaging studies  Basic Metabolic Panel: Recent Labs  Lab 05/04/20 0613 05/05/20 0500 05/06/20 0348  05/07/20 0440 05/08/20 0344  NA 141 141 142 141 139  K 4.3 4.7 4.9 4.8 5.0  CL 104 103 100 101 102  CO2 24 27 31 30 28   GLUCOSE 185* 162* 161* 164* 185*  BUN 32* 36* 41* 43* 40*  CREATININE 1.00 0.90 1.03* 0.94 0.86  CALCIUM 7.9* 8.1* 8.1* 8.2* 8.2*   Liver Function Tests: Recent Labs  Lab 05/04/20 0613 05/05/20 0500 05/06/20 0348 05/07/20 0440 05/08/20 0344  AST 69* 38 32 33 26  ALT 44 35 35 36 35  ALKPHOS 155* 137* 123 119 113  BILITOT 0.8 0.7 0.7 0.5 0.6  PROT 6.8 7.2 6.9 6.7 6.3*  ALBUMIN 2.2* 2.3* 2.4* 2.4* 2.3*   CBC: Recent Labs  Lab 05/02/20 0430 05/03/20 0603 05/04/20 0613 05/05/20 0500 05/06/20 0348 05/07/20 0440 05/08/20 0344  WBC 17.3*   < > 18.9* 18.5* 18.0* 22.5* 20.8*  NEUTROABS 14.3*  --   --   --   --   --   --   HGB 14.6   < > 13.8 14.5 14.1 14.4 13.8  HCT 46.8*   < > 45.0 46.2* 45.6 46.4* 44.7  MCV 92.7   < >  93.0 92.4 92.7 93.9 92.5  PLT 219   < > 302 368 386 422* 408*   < > = values in this interval not displayed.   Sepsis Labs:  Recent Results (from the past 240 hour(s))  MRSA PCR Screening     Status: None   Collection Time: 04/29/20 10:51 PM   Specimen: Nasal Mucosa; Nasopharyngeal  Result Value Ref Range Status   MRSA by PCR NEGATIVE NEGATIVE Final    Comment:        The GeneXpert MRSA Assay (FDA approved for NASAL specimens only), is one component of a comprehensive MRSA colonization surveillance program. It is not intended to diagnose MRSA infection nor to guide or monitor treatment for MRSA infections. Performed at [facility], [address] [place], [place] [address]      Scheduled Meds: . vitamin C  500 mg Oral Daily  . baricitinib  4 mg Oral Daily  . Chlorhexidine Gluconate Cloth  6 each Topical Daily  . dextromethorphan-guaiFENesin  1 tablet Oral BID  . enoxaparin (LOVENOX) injection  70 mg Subcutaneous Q12H  . feeding supplement (ENSURE ENLIVE)  237 mL Oral TID BM  . [START ON 05/09/2020] predniSONE  50 mg  Oral Q breakfast  . zinc sulfate  220 mg Oral Daily   Continuous Infusions:   Procedures/Studies: US Venous Img Lower Bilateral (DVT)  Result Date: 05/06/2020 CLINICAL DATA:  Positive D-dimer EXAM: BILATERAL LOWER EXTREMITY VENOUS DOPPLER ULTRASOUND TECHNIQUE: Gray-scale sonography with graded compression, as well as color Doppler and duplex ultrasound were performed to evaluate the lower extremity deep venous systems from the level of the common femoral vein and including the common femoral, femoral, profunda femoral, popliteal and calf veins including the posterior tibial, peroneal and gastrocnemius veins when visible. The superficial great saphenous vein was also interrogated. Spectral Doppler was utilized to evaluate flow at rest and with distal augmentation maneuvers in the common femoral, femoral and popliteal veins. COMPARISON:  None. FINDINGS: Very limited exam because of obese body habitus. RIGHT LOWER EXTREMITY Common Femoral Vein: No evidence of thrombus. Normal compressibility, respiratory phasicity and response to augmentation. Saphenofemoral Junction: No evidence of thrombus. Normal compressibility and flow on color Doppler imaging. Profunda Femoral Vein: No evidence of thrombus. Normal compressibility and flow on color Doppler imaging. Femoral Vein: No evidence of thrombus. Normal compressibility, respiratory phasicity and response to augmentation. Popliteal Vein: No evidence of thrombus. Normal compressibility, respiratory phasicity and response to augmentation. Calf Veins: No evidence of thrombus. Normal compressibility and flow on color Doppler imaging. LEFT LOWER EXTREMITY Common Femoral Vein: No evidence of thrombus. Normal compressibility, respiratory phasicity and response to augmentation. Saphenofemoral Junction: No evidence of thrombus. Normal compressibility and flow on color Doppler imaging. Profunda Femoral Vein: No evidence of thrombus. Normal compressibility and flow on color  Doppler imaging. Femoral Vein: No evidence of thrombus. Normal compressibility, respiratory phasicity and response to augmentation. Popliteal Vein: No evidence of thrombus. Normal compressibility, respiratory phasicity and response to augmentation. Calf Veins: No evidence of thrombus. Normal compressibility and flow on color Doppler imaging. IMPRESSION: Very limited exam because of body habitus. No significant occlusive or acute DVT visualized in either extremity. Electronically Signed   By: Judie PetitM.  Shick M.D.   On: 05/06/2020 12:04   DG Chest Port 1 View  Result Date: 04/22/2020 CLINICAL DATA:  Shortness of breath. EXAM: PORTABLE CHEST 1 VIEW COMPARISON:  None. FINDINGS: Mild cardiomegaly is noted. No pneumothorax is noted. Multiple small ill-defined opacities are noted throughout both lungs  concerning for possible multifocal pneumonia. Left pleural effusion cannot be excluded. Bony thorax is unremarkable. IMPRESSION: Multiple small ill-defined opacities are noted throughout both lungs concerning for possible multifocal pneumonia. Left pleural effusion cannot be excluded. Electronically Signed   By: Lupita Raider M.D.   On: 04/25/2020 19:42    Eragon Hammond Mariea Clonts, MD  [facility]  If 7PM-7AM, please contact night-coverage [email or web address]  05/08/2020, 5:38 PM   LOS: 11 days

## 2020-05-08 NOTE — Progress Notes (Signed)
Initial Nutrition Assessment  DOCUMENTATION CODES:   Morbid obesity  INTERVENTION:   -Continue Ensure Enlive po TID, each supplement provides 350 kcal and 20 grams of protein   -Magic cup daily    NUTRITION DIAGNOSIS:   Inadequate oral intake related to acute illness (COVID-19/pneumonia) as evidenced by estimated needs, meal completion < 25%.  Progressing with improved po intake.   GOAL:  Provide needs based on ASPEN/SCCM guidelines   Ongoing  MONITOR:  Supplement acceptance, PO intake, Labs, I & O's, Weight trends, TF tolerance   REASON FOR ASSESSMENT:   LOS    ASSESSMENT: Patient is a 59 yo female presented on 9/13-with poor oral intake, shortness of breath, generalized weakness. Acute respiratory failure related to COVID-19/ pneumonia.   Discussed meal intake with nursing and MD. Patient is not currently meeting estimated needs which have now increased as she is > 5 days in ICU with COVID. Recommend offer high calorie /high protein oral supplement TID. Consider enteral feeding if patient is amiable to have alternate source of nutrition support.  Per chart review: Pt intake of one meal since admission-25%. On 9/22 pt documented 75% lunch and 100% dinner. She was also up in chair and tolerated well per nursing.   9/24-No meal intake documented the past 2 days. Talked with her nurse who reports 100% of breakfast today and 100% of Ensure this morning. Patient appetite and intake have significantly improved.  Palliative is following -pt is DNR/DNI but wishes full scope of treatment.  Medications reviewed and include: Vitamin C, Zinc sulfate.  Drips: Methylprednisolone    Intake/Output Summary (Last 24 hours) at 05/08/2020 1125 Last data filed at 05/08/2020 1100 Gross per 24 hour  Intake 357.92 ml  Output 600 ml  Net -242.08 ml  9/13- Admit weight: 158.8 kg    Labs: Reviewed BMP Latest Ref Rng & Units 05/08/2020 05/07/2020 05/06/2020  Glucose 70 - 99 mg/dL 497(W)  263(Z) 858(I)  BUN 6 - 20 mg/dL 50(Y) 77(A) 12(I)  Creatinine 0.44 - 1.00 mg/dL 7.86 7.67 2.09(O)  Sodium 135 - 145 mmol/L 139 141 142  Potassium 3.5 - 5.1 mmol/L 5.0 4.8 4.9  Chloride 98 - 111 mmol/L 102 101 100  CO2 22 - 32 mmol/L 28 30 31   Calcium 8.9 - 10.3 mg/dL 8.2(L) 8.2(L) 8.1(L)     NUTRITION - FOCUSED PHYSICAL EXAM: deferred  Diet Order:   Diet Order            Diet regular Room service appropriate? Yes; Fluid consistency: Thin  Diet effective now                 EDUCATION NEEDS:   Not appropriate for education at this time Skin:  Skin Assessment: Reviewed RN Assessment (2+ pitting edema BLE)  Last BM:  9/23  Height:   Ht Readings from Last 1 Encounters:  05/04/20 5\' 8"  (1.727 m)    Weight:   Wt Readings from Last 1 Encounters:  05/05/20 (!) 185.9 kg    Ideal Body Weight:   64 kg  BMI:  Body mass index is 62.32 kg/m.  Estimated Nutritional Needs:   Kcal:  2400 (adjusted bw) ICU > 5 days  Protein:  125-146 gr  Fluid:  >2400 ml daily   MS,RD,CSG,LDN Pager: 05/07/20

## 2020-05-08 NOTE — Plan of Care (Signed)

## 2020-05-09 LAB — C-REACTIVE PROTEIN: CRP: 0.6 mg/dL (ref <1.0)

## 2020-05-09 LAB — D-DIMER, QUANTITATIVE: D-Dimer, Quant: 10.27 ug/mL-FEU — ABNORMAL HIGH (ref 0.00–0.50)

## 2020-05-09 LAB — FERRITIN: Ferritin: 640 ng/mL — ABNORMAL HIGH (ref 11–307)

## 2020-05-09 MED ORDER — TRAZODONE HCL 50 MG PO TABS
50.0000 mg | ORAL_TABLET | Freq: Once | ORAL | Status: AC
  Administered 2020-05-09: 50 mg via ORAL
  Filled 2020-05-09: qty 1

## 2020-05-09 NOTE — Progress Notes (Signed)
PROGRESS NOTE  Lisa GumLillian T Cavallero ZOX:[id number] DOB: 05/08/1961 DOA: 04/22/2020 PCP: Patient, No Pcp Per  Brief History:  59 year old female with no documented chronic medical problems presenting with 1 week history of nonproductive cough, poor oral intake, shortness of breath, myalgias, and headache with associated loose stools.  The patient states that in the past 2 days her coughing and shortness of breath and generalized weakness had worsened.  In addition, she has lost her sense of smell.  The patient has been complaining of loose stools for the past 3 days.  She has had 3-4 loose bowel movements daily.  She denies any hematochezia but complains of some black stool.  She states that she has been taking Pepto-Bismol.  The patient has been having some subjective fevers and chills.  She has not been vaccinated against COVID-19.  She lives with her foster daughter who had some type of viral infection 2 weeks prior to the patient's admission.  The patient went to urgent care on 04/26/2020.  She was noted to have oxygen saturation in the 60s.  She was sent to emergency department for further evaluation. In the emergency department, the patient had temperature up to 102.9 F.  She was hemodynamically stable.  Oxygen saturation was 88-90% on a nonrebreather.  The patient was started on Solu-Medrol and remdesivir.   Assessment/Plan:  1)Acute respiratory failure secondary to COVID-19 pneumonia. -Completed IV Solu-Medrol x 10 days on 05/08/20 -Prednisone for 4 days started on 05/08/2020 -She completed Remdesivir on 05/01/20 -Continue the use of Vitamin C and zinc -Continue incentive respirometer -Patient unable to do prone positioning, advised to avoid laying on her back, she will try and lay on her side -continue baricitinib day 12 of  14 days) -CRP 22.1>17.7>11.0>16.1>3.7>2.8>> 0.6 -Ferritin 1457>3,063>3160>1341>1220>755>>640 -D-dimer 1.70>2.25>4.32>>20>>20>13.6>> 10.27 -PCT  0.27 -Severe/profound hypoxia persist -ABG done on 05/08/2020 was done on a combination of HFNC AND NRB-and PO2 was 66 -Plan of care, advanced directives and goals of care discussed with patient patient request no intubation, patient request DNR/DNI, patient remains full scope of treatment otherwise -can consider Bipap if patient's shortness of breath worsens, although this may be of limited benefit -with D dimer >20 initially , Lovenox was changed to therapeutic dosing; moving forward will treat for 1 month given patient's high risk for micro emboli. -Follow-up lower extremity venous Dopplers-initial Doppler studies were poor quality due to patient's body habitus -Prior to discharge we will attempt CTA of the chest to rule out PE.---Patient is too hypoxic and too medically unstable at this time to lie flat for CTA chest   2)AKI -No prior values to compare -Presented with serum creatinine 1.36 -AKI resolved with hydration   3)Morbid obesity -BMI 62 -At risk for increased morbidity and mortality from COVID-19 -lifestyle modification, portion control, low calorie diet and increase physical activity discussed with patient-after resolution of acute illness -Once medically stabilized and discharge will benefit of following with bariatric clinic   4)Social/Ethics- --Plan of care, advanced directives and goals of care discussed with patient patient request no intubation, patient request DNR/DNI, patient remains full scope of treatment otherwise -On multiple occasions, she affirmed that she would not want to receive CPR or to be placed on a ventilator -She understands that if her respiratory condition continues to deteriorate then she may not survive this illness -She also correctly states that even with a ventilator, her recovery would not be certain -If she continues to worsen, then "  when it is my time, it is my time" -If the situation were to arise, she wished to focus on comfort. -She has  identified her sister [name] as main point of contact, I am glad for [place] to update intermittently with plan of care. -palliative care following, continue current treatments  5) generalized weakness and deconditioning----given prolonged illness with prolonged hypoxia and morbid obesity----patient will most likely need SNF rehab prior to being able to return home --Patient is too unstable at this time from a cardiopulmonary standpoint to allow for physical therapy evaluation  Status is: Inpatient  Remains inpatient appropriate because:IV treatments appropriate due to intensity of illness or inability to take PO   Dispo: The patient is from: Home              Anticipated d/c is to: Home              Anticipated d/c date is: > 3 days              Patient currently is not medically stable to d/c.   Family Communication:   No family at bedside. Sister updated over the phone   Consultants:  none  Code Status: DNR, confirmed with patient  DVT Prophylaxis:   Therapeutic lovenox   Procedures: As Listed in Progress Note Above  Antibiotics: None   Subjective:  -Continues to have significant dyspnea on with minimal activity -Severe hypoxia persists requiring a combination of nonrebreather bag and high flow nasal cannula at 15 L -Fatigue and malaise persist  Objective: Vitals:   05/09/20 0856 05/09/20 1054 05/09/20 1101 05/09/20 1208  BP: 129/77     Pulse: 78 90 70   Resp: 18 (!) 25 20   Temp:   97.9 F (36.6 C)   TempSrc:   Oral   SpO2: (!) 88% (!) 89% 94% 95%  Weight:      Height:        Intake/Output Summary (Last 24 hours) at 05/09/2020 1330 Last data filed at 05/09/2020 0400 Gross per 24 hour  Intake 830 ml  Output 1050 ml  Net -220 ml   Weight change:   Exam: General exam:  -Morbidly obese, conversational dyspnea HEENT- HFNC And NRB Respiratory system: Air movement is fair, scattered rhonchi noted, no wheezing  cardiovascular system:RRR. No murmurs, rubs,  gallops.  Gastrointestinal system: +BS, increased truncal adiposity, NT, ND Central nervous system: Generalized weakness, alert and oriented. No focal neurological deficits. Extremities: No cyanosis or clubbing. Skin: No rashes, no petechiae. Psychiatry: Judgement and insight appear normal. Mood & affect appropriate.     Data Reviewed: I have personally reviewed following labs and imaging studies  Basic Metabolic Panel: Recent Labs  Lab 05/04/20 0613 05/05/20 0500 05/06/20 0348 05/07/20 0440 05/08/20 0344  NA 141 141 142 141 139  K 4.3 4.7 4.9 4.8 5.0  CL 104 103 100 101 102  CO2 24 27 31 30 28   GLUCOSE 185* 162* 161* 164* 185*  BUN 32* 36* 41* 43* 40*  CREATININE 1.00 0.90 1.03* 0.94 0.86  CALCIUM 7.9* 8.1* 8.1* 8.2* 8.2*   Liver Function Tests: Recent Labs  Lab 05/04/20 0613 05/05/20 0500 05/06/20 0348 05/07/20 0440 05/08/20 0344  AST 69* 38 32 33 26  ALT 44 35 35 36 35  ALKPHOS 155* 137* 123 119 113  BILITOT 0.8 0.7 0.7 0.5 0.6  PROT 6.8 7.2 6.9 6.7 6.3*  ALBUMIN 2.2* 2.3* 2.4* 2.4* 2.3*   CBC: Recent Labs  Lab 05/04/20 [id number]  05/05/20 0500 05/06/20 0348 05/07/20 0440 05/08/20 0344  WBC 18.9* 18.5* 18.0* 22.5* 20.8*  HGB 13.8 14.5 14.1 14.4 13.8  HCT 45.0 46.2* 45.6 46.4* 44.7  MCV 93.0 92.4 92.7 93.9 92.5  PLT 302 368 386 422* 408*   Sepsis Labs:  Recent Results (from the past 240 hour(s))  MRSA PCR Screening     Status: None   Collection Time: 04/29/20 10:51 PM   Specimen: Nasal Mucosa; Nasopharyngeal  Result Value Ref Range Status   MRSA by PCR NEGATIVE NEGATIVE Final    Comment:        The GeneXpert MRSA Assay (FDA approved for NASAL specimens only), is one component of a comprehensive MRSA colonization surveillance program. It is not intended to diagnose MRSA infection nor to guide or monitor treatment for MRSA infections. Performed at [facility], [address], [place], [place] [address]      Scheduled Meds: . vitamin C  500 mg  Oral Daily  . baricitinib  4 mg Oral Daily  . Chlorhexidine Gluconate Cloth  6 each Topical Daily  . dextromethorphan-guaiFENesin  1 tablet Oral BID  . enoxaparin (LOVENOX) injection  70 mg Subcutaneous Q12H  . feeding supplement (ENSURE ENLIVE)  237 mL Oral TID BM  . predniSONE  50 mg Oral Q breakfast  . zinc sulfate  220 mg Oral Daily   Continuous Infusions:   Procedures/Studies: US Venous Img Lower Bilateral (DVT)  Result Date: 05/06/2020 CLINICAL DATA:  Positive D-dimer EXAM: BILATERAL LOWER EXTREMITY VENOUS DOPPLER ULTRASOUND TECHNIQUE: Gray-scale sonography with graded compression, as well as color Doppler and duplex ultrasound were performed to evaluate the lower extremity deep venous systems from the level of the common femoral vein and including the common femoral, femoral, profunda femoral, popliteal and calf veins including the posterior tibial, peroneal and gastrocnemius veins when visible. The superficial great saphenous vein was also interrogated. Spectral Doppler was utilized to evaluate flow at rest and with distal augmentation maneuvers in the common femoral, femoral and popliteal veins. COMPARISON:  None. FINDINGS: Very limited exam because of obese body habitus. RIGHT LOWER EXTREMITY Common Femoral Vein: No evidence of thrombus. Normal compressibility, respiratory phasicity and response to augmentation. Saphenofemoral Junction: No evidence of thrombus. Normal compressibility and flow on color Doppler imaging. Profunda Femoral Vein: No evidence of thrombus. Normal compressibility and flow on color Doppler imaging. Femoral Vein: No evidence of thrombus. Normal compressibility, respiratory phasicity and response to augmentation. Popliteal Vein: No evidence of thrombus. Normal compressibility, respiratory phasicity and response to augmentation. Calf Veins: No evidence of thrombus. Normal compressibility and flow on color Doppler imaging. LEFT LOWER EXTREMITY Common Femoral Vein: No  evidence of thrombus. Normal compressibility, respiratory phasicity and response to augmentation. Saphenofemoral Junction: No evidence of thrombus. Normal compressibility and flow on color Doppler imaging. Profunda Femoral Vein: No evidence of thrombus. Normal compressibility and flow on color Doppler imaging. Femoral Vein: No evidence of thrombus. Normal compressibility, respiratory phasicity and response to augmentation. Popliteal Vein: No evidence of thrombus. Normal compressibility, respiratory phasicity and response to augmentation. Calf Veins: No evidence of thrombus. Normal compressibility and flow on color Doppler imaging. IMPRESSION: Very limited exam because of body habitus. No significant occlusive or acute DVT visualized in either extremity. Electronically Signed   By: Judie Petit.  Shick M.D.   On: 05/06/2020 12:04   DG Chest Port 1 View  Result Date: 05/10/2020 CLINICAL DATA:  Shortness of breath. EXAM: PORTABLE CHEST 1 VIEW COMPARISON:  None. FINDINGS: Mild cardiomegaly is noted. No  pneumothorax is noted. Multiple small ill-defined opacities are noted throughout both lungs concerning for possible multifocal pneumonia. Left pleural effusion cannot be excluded. Bony thorax is unremarkable. IMPRESSION: Multiple small ill-defined opacities are noted throughout both lungs concerning for possible multifocal pneumonia. Left pleural effusion cannot be excluded. Electronically Signed   By: Lupita Raider M.D.   On: 04/15/2020 19:42    Rona Tomson Mariea Clonts, MD  [facility]  If 7PM-7AM, please contact night-coverage [email or web address]  05/09/2020, 1:30 PM   LOS: 12 days

## 2020-05-09 NOTE — Plan of Care (Signed)
  Problem: Education: Goal: Knowledge of General Education information will improve Description: Including pain rating scale, medication(s)/side effects and non-pharmacologic comfort measures Outcome: Progressing   Problem: Clinical Measurements: Goal: Will remain free from infection Outcome: Progressing   Problem: Safety: Goal: Ability to remain free from injury will improve Outcome: Progressing   Problem: Pain Managment: Goal: General experience of comfort will improve Outcome: Progressing

## 2020-05-10 LAB — COMPREHENSIVE METABOLIC PANEL
ALT: 29 U/L (ref 0–44)
AST: 33 U/L (ref 15–41)
Albumin: 2.3 g/dL — ABNORMAL LOW (ref 3.5–5.0)
Alkaline Phosphatase: 86 U/L (ref 38–126)
Anion gap: 11 (ref 5–15)
BUN: 33 mg/dL — ABNORMAL HIGH (ref 6–20)
CO2: 30 mmol/L (ref 22–32)
Calcium: 8.4 mg/dL — ABNORMAL LOW (ref 8.9–10.3)
Chloride: 98 mmol/L (ref 98–111)
Creatinine, Ser: 0.8 mg/dL (ref 0.44–1.00)
GFR calc Af Amer: >60 mL/min (ref >60)
GFR calc non Af Amer: >60 mL/min (ref >60)
Glucose, Bld: 111 mg/dL — ABNORMAL HIGH (ref 70–99)
Potassium: 4.4 mmol/L (ref 3.5–5.1)
Sodium: 139 mmol/L (ref 135–145)
Total Bilirubin: 0.8 mg/dL (ref 0.3–1.2)
Total Protein: 6 g/dL — ABNORMAL LOW (ref 6.5–8.1)

## 2020-05-10 MED ORDER — METHOCARBAMOL 500 MG PO TABS
1000.0000 mg | ORAL_TABLET | Freq: Once | ORAL | Status: AC
  Administered 2020-05-10: 1000 mg via ORAL
  Filled 2020-05-10: qty 2

## 2020-05-10 MED ORDER — OXYCODONE HCL 5 MG PO TABS
10.0000 mg | ORAL_TABLET | Freq: Once | ORAL | Status: AC
  Administered 2020-05-10: 10 mg via ORAL
  Filled 2020-05-10: qty 2

## 2020-05-10 NOTE — Progress Notes (Signed)
PROGRESS NOTE  Lisa Diaz NMM:[id number] DOB: Nov 06, 1960 DOA: 05/08/20 PCP: Patient, No Pcp Per  Brief History:  59 year old female with no documented chronic medical problems presenting with 1 week history of nonproductive cough, poor oral intake, shortness of breath, myalgias, and headache with associated loose stools.  The patient states that in the past 2 days her coughing and shortness of breath and generalized weakness had worsened.  In addition, she has lost her sense of smell.  The patient has been complaining of loose stools for the past 3 days.  She has had 3-4 loose bowel movements daily.  She denies any hematochezia but complains of some black stool.  She states that she has been taking Pepto-Bismol.  The patient has been having some subjective fevers and chills.  She has not been vaccinated against COVID-19.  She lives with her foster daughter who had some type of viral infection 2 weeks prior to the patient's admission.  The patient went to urgent care on 05-08-20.  She was noted to have oxygen saturation in the 60s.  She was sent to emergency department for further evaluation. In the emergency department, the patient had temperature up to 102.9 F.  She was hemodynamically stable.  Oxygen saturation was 88-90% on a nonrebreather.  The patient was started on Solu-Medrol and remdesivir. - Severe/significant hypoxia requiring a combination of nonrebreather bag AND high flow nasal cannula at 15 L/min to keep O2 sats close to 90 even at rest  Assessment/Plan:  1)Acute respiratory failure secondary to COVID-19 pneumonia. -Completed IV Solu-Medrol x 10 days on 05/08/20 -Prednisone for 4 days started on 05/08/2020 -She completed Remdesivir on 05/01/20 -Continue the use of Vitamin C and zinc -Continue incentive respirometer -Patient unable to do prone positioning, advised to avoid laying on her back, she will try and lay on her side -continue baricitinib day 12 of  14  days) -CRP 22.1>17.7>11.0>16.1>3.7>2.8>> 0.6 -Ferritin 1457>3,063>3160>1341>1220>755>>640 -D-dimer 1.70>2.25>4.32>>20>>20>13.6>> 10.27 -PCT 0.27 -Severe/profound hypoxia persist -ABG done on 05/08/2020 was done on a combination of HFNC AND NRB-and PO2 was 66 -Plan of care, advanced directives and goals of care discussed with patient patient request no intubation, patient request DNR/DNI, patient remains full scope of treatment otherwise -can consider Bipap if patient's shortness of breath worsens, although this may be of limited benefit -with D dimer >20 initially , Lovenox was changed to therapeutic dosing; moving forward will treat for 1 month given patient's high risk for micro emboli. -Follow-up lower extremity venous Dopplers-initial Doppler studies were poor quality due to patient's body habitus -Prior to discharge we will attempt CTA of the chest to rule out PE.---Patient is too hypoxic and too medically unstable at this time to lie flat for CTA chest COVID-19 Labs  Recent Labs    05/08/20 0344 05/09/20 0507 05/09/20 0509  DDIMER 8.12*  --  10.27*  FERRITIN 568* 640*  --   CRP 0.6 0.6  --     Lab Results  Component Value Date   SARSCOV2NAA POSITIVE (A) 05/08/2020   2)AKI -No prior values to compare -Presented with serum creatinine 1.36 -AKI resolved with hydration   3)Morbid obesity -BMI 62 -At risk for increased morbidity and mortality from COVID-19 -lifestyle modification, portion control, low calorie diet and increase physical activity discussed with patient-after resolution of acute illness -Once medically stabilized and discharge will benefit of following with bariatric clinic  4)Social/Ethics- --Plan of care, advanced directives and goals of care discussed  with patient patient request no intubation, patient request DNR/DNI, patient remains full scope of treatment otherwise -On multiple occasions, she affirmed that she would not want to receive CPR or to be placed  on a ventilator -She understands that if her respiratory condition continues to deteriorate then she may not survive this illness -She also correctly states that even with a ventilator, her recovery would not be certain -If she continues to worsen, then " when it is my time, it is my time" -If the situation were to arise, she wished to focus on comfort. -She has identified her sister [name] as main point of contact, I am glad for [place] to update intermittently with plan of care. -palliative care following, continue current treatments  5)Generalized weakness and deconditioning----given prolonged illness with prolonged hypoxia and morbid obesity----patient will most likely need SNF rehab prior to being able to return home --Patient is too unstable at this time from a cardiopulmonary standpoint to allow for physical therapy evaluation  Status is: Inpatient  Remains inpatient appropriate because:Severe/significant hypoxia requiring a combination of nonrebreather bag AND high flow nasal cannula at 15 L/min to keep O2 sats close to 90 even at rest  Dispo: The patient is from: Home              Anticipated d/c is to: Home              Anticipated d/c date is: > 3 days              Patient currently is not medically stable to d/c. Barriers to Dc--:-Severe/significant hypoxia requiring a combination of nonrebreather bag AND high flow nasal cannula at 15 L/min to keep O2 sats close to 90 even at rest  Family Communication:   No family at bedside.  Discussed with [name] over the phone  [phone]  Consultants:  none  Code Status: DNR, confirmed with patient  DVT Prophylaxis:   Therapeutic lovenox  Procedures: As Listed in Progress Note Above  Antibiotics: None  Subjective:  05/10/20 -Continues to have significant dyspnea on with minimal activity -Severe hypoxia persists requiring a combination of nonrebreather bag AND high flow nasal cannula at 15  L -Fatigue and malaise persist  Objective: Vitals:   05/10/20 0400 05/10/20 0732 05/10/20 0800 05/10/20 1150  BP: 122/80  103/64   Pulse: (!) 115 (!) 109 (!) 109 84  Resp: (!) 30 (!) 28 (!) 32 (!) 23  Temp: 98 F (36.7 C) 98.7 F (37.1 C)  98.4 F (36.9 C)  TempSrc: Axillary Oral  Oral  SpO2: (!) 88% (!) 84% 94% 93%  Weight:      Height:        Intake/Output Summary (Last 24 hours) at 05/10/2020 1348 Last data filed at 05/10/2020 0600 Gross per 24 hour  Intake 240 ml  Output 1700 ml  Net -1460 ml   Weight change:   Exam: General exam:  -Morbidly obese, conversational dyspnea HEENT- HFNC And NRB Respiratory system: Air movement is fair, scattered rhonchi noted, no wheezing  cardiovascular system:RRR. No murmurs, rubs, gallops.  Gastrointestinal system: +BS, increased truncal adiposity, NT, ND Central nervous system: Generalized weakness, alert and oriented. No focal neurological deficits. Extremities: No cyanosis or clubbing. Skin: No rashes, no petechiae. Psychiatry: Judgement and insight appear normal. Mood & affect appropriate.    Data Reviewed: I have personally reviewed following labs and imaging studies  Basic Metabolic Panel: Recent Labs  Lab 05/05/20 0500 05/06/20 0348 05/07/20 0440  05/08/20 0344 05/10/20 0646  NA 141 142 141 139 139  K 4.7 4.9 4.8 5.0 4.4  CL 103 100 101 102 98  CO2 27 31 30 28 30   GLUCOSE 162* 161* 164* 185* 111*  BUN 36* 41* 43* 40* 33*  CREATININE 0.90 1.03* 0.94 0.86 0.80  CALCIUM 8.1* 8.1* 8.2* 8.2* 8.4*   Liver Function Tests: Recent Labs  Lab 05/05/20 0500 05/06/20 0348 05/07/20 0440 05/08/20 0344 05/10/20 0646  AST 38 32 33 26 33  ALT 35 35 36 35 29  ALKPHOS 137* 123 119 113 86  BILITOT 0.7 0.7 0.5 0.6 0.8  PROT 7.2 6.9 6.7 6.3* 6.0*  ALBUMIN 2.3* 2.4* 2.4* 2.3* 2.3*   CBC: Recent Labs  Lab 05/04/20 0613 05/05/20 0500 05/06/20 0348 05/07/20 0440 05/08/20 0344  WBC 18.9* 18.5* 18.0* 22.5* 20.8*  HGB 13.8  14.5 14.1 14.4 13.8  HCT 45.0 46.2* 45.6 46.4* 44.7  MCV 93.0 92.4 92.7 93.9 92.5  PLT 302 368 386 422* 408*   Sepsis Labs:  No results found for this or any previous visit (from the past 240 hour(s)).   Scheduled Meds: . vitamin C  500 mg Oral Daily  . baricitinib  4 mg Oral Daily  . Chlorhexidine Gluconate Cloth  6 each Topical Daily  . dextromethorphan-guaiFENesin  1 tablet Oral BID  . enoxaparin (LOVENOX) injection  70 mg Subcutaneous Q12H  . feeding supplement (ENSURE ENLIVE)  237 mL Oral TID BM  . predniSONE  50 mg Oral Q breakfast  . zinc sulfate  220 mg Oral Daily   Continuous Infusions:  Procedures/Studies: 05/10/20 Venous Img Lower Bilateral (DVT)  Result Date: 05/06/2020 CLINICAL DATA:  Positive D-dimer EXAM: BILATERAL LOWER EXTREMITY VENOUS DOPPLER ULTRASOUND TECHNIQUE: Gray-scale sonography with graded compression, as well as color Doppler and duplex ultrasound were performed to evaluate the lower extremity deep venous systems from the level of the common femoral vein and including the common femoral, femoral, profunda femoral, popliteal and calf veins including the posterior tibial, peroneal and gastrocnemius veins when visible. The superficial great saphenous vein was also interrogated. Spectral Doppler was utilized to evaluate flow at rest and with distal augmentation maneuvers in the common femoral, femoral and popliteal veins. COMPARISON:  None. FINDINGS: Very limited exam because of obese body habitus. RIGHT LOWER EXTREMITY Common Femoral Vein: No evidence of thrombus. Normal compressibility, respiratory phasicity and response to augmentation. Saphenofemoral Junction: No evidence of thrombus. Normal compressibility and flow on color Doppler imaging. Profunda Femoral Vein: No evidence of thrombus. Normal compressibility and flow on color Doppler imaging. Femoral Vein: No evidence of thrombus. Normal compressibility, respiratory phasicity and response to augmentation. Popliteal  Vein: No evidence of thrombus. Normal compressibility, respiratory phasicity and response to augmentation. Calf Veins: No evidence of thrombus. Normal compressibility and flow on color Doppler imaging. LEFT LOWER EXTREMITY Common Femoral Vein: No evidence of thrombus. Normal compressibility, respiratory phasicity and response to augmentation. Saphenofemoral Junction: No evidence of thrombus. Normal compressibility and flow on color Doppler imaging. Profunda Femoral Vein: No evidence of thrombus. Normal compressibility and flow on color Doppler imaging. Femoral Vein: No evidence of thrombus. Normal compressibility, respiratory phasicity and response to augmentation. Popliteal Vein: No evidence of thrombus. Normal compressibility, respiratory phasicity and response to augmentation. Calf Veins: No evidence of thrombus. Normal compressibility and flow on color Doppler imaging. IMPRESSION: Very limited exam because of body habitus. No significant occlusive or acute DVT visualized in either extremity. Electronically Signed   By: 05/08/2020.  Shick  M.D.   On: 05/06/2020 12:04   DG Chest Port 1 View  Result Date: 2020-03-01 CLINICAL DATA:  Shortness of breath. EXAM: PORTABLE CHEST 1 VIEW COMPARISON:  None. FINDINGS: Mild cardiomegaly is noted. No pneumothorax is noted. Multiple small ill-defined opacities are noted throughout both lungs concerning for possible multifocal pneumonia. Left pleural effusion cannot be excluded. Bony thorax is unremarkable. IMPRESSION: Multiple small ill-defined opacities are noted throughout both lungs concerning for possible multifocal pneumonia. Left pleural effusion cannot be excluded. Electronically Signed   By: Lupita RaiderJames  Green Jr M.D.   On: 02021-07-18 19:42   Deuce Paternoster Mariea ClontsEmokpae, MD  [facility]  If 7PM-7AM, please contact night-coverage [email or web address]  05/10/2020, 1:48 PM   LOS: 13 days

## 2020-05-10 NOTE — Progress Notes (Signed)
Patient given full bath and linen change.  Patient then assisted to chair with 2 assist.  Patient did well with the walker.  Her oxygen did decrease with movement, and came back up with reminders to slow her breathing down.  Patient resting comfortably with her eyes closed, call bell within reach, and legs raised.  At this time pt's oxygen is showing 90% with a HR of 98

## 2020-05-11 LAB — COMPREHENSIVE METABOLIC PANEL
ALT: 27 U/L (ref 0–44)
AST: 37 U/L (ref 15–41)
Albumin: 2.1 g/dL — ABNORMAL LOW (ref 3.5–5.0)
Alkaline Phosphatase: 70 U/L (ref 38–126)
Anion gap: 9 (ref 5–15)
BUN: 29 mg/dL — ABNORMAL HIGH (ref 6–20)
CO2: 30 mmol/L (ref 22–32)
Calcium: 7.9 mg/dL — ABNORMAL LOW (ref 8.9–10.3)
Chloride: 100 mmol/L (ref 98–111)
Creatinine, Ser: 0.76 mg/dL (ref 0.44–1.00)
GFR calc Af Amer: >60 mL/min (ref >60)
GFR calc non Af Amer: >60 mL/min (ref >60)
Glucose, Bld: 117 mg/dL — ABNORMAL HIGH (ref 70–99)
Potassium: 4.5 mmol/L (ref 3.5–5.1)
Sodium: 139 mmol/L (ref 135–145)
Total Bilirubin: 0.7 mg/dL (ref 0.3–1.2)
Total Protein: 5.6 g/dL — ABNORMAL LOW (ref 6.5–8.1)

## 2020-05-11 MED ORDER — MORPHINE SULFATE (PF) 2 MG/ML IV SOLN
2.0000 mg | INTRAVENOUS | Status: DC | PRN
  Administered 2020-05-12 – 2020-05-14 (×3): 2 mg via INTRAVENOUS
  Filled 2020-05-11 (×3): qty 1

## 2020-05-11 MED ORDER — OXYCODONE HCL 5 MG PO TABS
10.0000 mg | ORAL_TABLET | Freq: Four times a day (QID) | ORAL | Status: DC | PRN
  Administered 2020-05-11 – 2020-05-13 (×4): 10 mg via ORAL
  Filled 2020-05-11 (×4): qty 2

## 2020-05-11 NOTE — Progress Notes (Signed)
PROGRESS NOTE  Lisa Diaz NID:[id number] DOB: 1961-01-12 DOA: 05/03/2020 PCP: Lisa Diaz  Brief History:  59 year old female with no documented chronic medical problems presenting with 1 week history of nonproductive cough, poor oral intake, shortness of breath, myalgias, and headache with associated loose stools.  The patient states that in the past 2 days her coughing and shortness of breath and generalized weakness had worsened.  In addition, she has lost her sense of smell.  The patient has been complaining of loose stools for the past 3 days.  She has had 3-4 loose bowel movements daily.  She denies any hematochezia but complains of some black stool.  She states that she has been taking Pepto-Bismol.  The patient has been having some subjective fevers and chills.  She has not been vaccinated against COVID-19.  She lives with her foster daughter who had some type of viral infection 2 weeks prior to the patient's admission.  The patient went to urgent care on 04/21/2020.  She was noted to have oxygen saturation in the 60s.  She was sent to emergency department for further evaluation. In the emergency department, the patient had temperature up to 102.9 F.  She was hemodynamically stable.  Oxygen saturation was 88-90% on a nonrebreather.  The patient was started on Solu-Medrol and remdesivir. - Severe/significant hypoxia requiring a combination of nonrebreather bag AND high flow nasal cannula  to keep O2 sats close to 90 even at rest  Assessment/Plan:  1)Acute respiratory failure secondary to COVID-19 pneumonia. -Completed IV Solu-Medrol x 10 days on 05/08/20 -Prednisone for 4 days started on 05/08/2020 -She completed Remdesivir on 05/01/20 -Continue the use of Vitamin C and zinc -Continue incentive respirometer -Patient unable to do prone positioning, advised to avoid laying on her back, she will try and lay on her side -continue baricitinib day 12 of  14 days) -CRP  22.1>17.7>11.0>16.1>3.7>2.8>> 0.6 -Ferritin 1457>3,063>3160>1341>1220>755>>640 -D-dimer 1.70>2.25>4.32>>20>>20>13.6>> 10.27 -PCT 0.27 05/11/20- -Severe/profound hypoxia persist even at Rest  80L heated high flow at 100% AND the non rebreather and she is sating 80% -Discussed with Dr Lisa Mourning ---and Discussed with Resp Therapist -oxygen delivery systems and  parameters adjusted  -Plan of care, advanced directives and goals of care discussed with patient patient request no intubation, patient request DNR/DNI, patient remains full scope of treatment otherwise -can consider Bipap if patient's shortness of breath worsens, although this may be of limited benefit -with D dimer >20 initially , Lovenox was changed to therapeutic dosing; moving forward will treat for 1 month given patient's high risk for micro emboli. -Follow-up lower extremity venous Dopplers-initial Doppler studies were poor quality due to patient's body habitus -Prior to discharge we will attempt CTA of the chest to rule out PE.---Patient is too hypoxic and too medically unstable at this time to lie flat for CTA chest COVID-19 Labs  Recent Labs    05/09/20 0507 05/09/20 0509  DDIMER  --  10.27*  FERRITIN 640*  --   CRP 0.6  --     Lab Results  Component Value Date   SARSCOV2NAA POSITIVE (A) 05/13/2020   2)AKI -No prior values to compare -Presented with serum creatinine 1.36 -AKI resolved with hydration   3)Morbid obesity -BMI 62 -At risk for increased morbidity and mortality from COVID-19 -lifestyle modification, portion control, low calorie diet and increase physical activity discussed with patient-after resolution of acute illness -Once medically stabilized and discharge will benefit of following  with bariatric clinic  4)Social/Ethics- --Plan of care, advanced directives and goals of care discussed with patient patient request no intubation, patient request DNR/DNI, patient remains full scope of treatment  otherwise -On multiple occasions, she affirmed that she would not want to receive CPR or to be placed on a ventilator -She understands that if her respiratory condition continues to deteriorate then she may not survive this illness -She also correctly states that even with a ventilator, her recovery would not be certain -If she continues to worsen, then " when it is my time, it is my time" -If the situation were to arise, she wished to focus on comfort. -She has identified her sister Lisa Diaz as main point of contact, I am glad for us to update intermittently with plan of care. -palliative care following, continue current treatments  5)Generalized weakness and deconditioning----given prolonged illness with prolonged hypoxia and morbid obesity----patient will most likely need SNF rehab prior to being able to return home --Patient is too unstable at this time from a cardiopulmonary standpoint to allow for physical therapy evaluation  Status is: Inpatient  Remains inpatient appropriate because:Severe/significant hypoxia requiring a combination of nonrebreather bag AND high flow nasal cannula to keep O2 sats close to 90 even at rest  Dispo: The patient is from: Home              Anticipated d/c is to: Home              Anticipated d/c date is: > 3 days              Patient currently is not medically stable to d/c. Barriers to Dc--:-Severe/significant hypoxia requiring a combination of nonrebreather bag AND high flow nasal cannula to keep O2 sats close to 90 even at rest   Family Communication:   No family at bedside.  Discussed with her Sister Lisa Diaz---updated over the phone  [phone]  Consultants:  Discussed with Dr. Cyril MourningAlva Diaz  Code Status: DNR, confirmed with patient  DVT Prophylaxis:   Therapeutic lovenox  Procedures: As Listed in Progress Note Above  Antibiotics: None  Subjective:  05/11/20- -Severe/profound hypoxia persist even at Rest  80L heated high flow  at 100% AND the non rebreather and she is sating 80% -Discussed with Dr Lisa Diaz ---and Discussed with Resp Therapist -oxygen delivery systems and  parameters adjusted -Fatigue and malaise persist  Objective: Vitals:   05/11/20 0035 05/11/20 0400 05/11/20 0800 05/11/20 0923  BP: 115/75 121/65 (!) 109/56   Pulse:      Resp: (!) 26 (!) 25 (!) 24   Temp:  97.7 F (36.5 C)    TempSrc:  Oral    SpO2:    (!) 86%  Weight:      Height:        Intake/Output Summary (Last 24 hours) at 05/11/2020 0931 Last data filed at 05/11/2020 0500 Gross Diaz 24 hour  Intake --  Output 800 ml  Net -800 ml   Weight change:   Exam: General exam:  -Morbidly obese, conversational dyspnea HEENT- HFNC And NRB Respiratory system: Air movement is fair, scattered rhonchi noted, no wheezing  cardiovascular system:RRR. No murmurs, rubs, gallops.  Gastrointestinal system: +BS, increased truncal adiposity, NT, ND Central nervous system: Generalized weakness, alert and oriented. No focal neurological deficits. Extremities: No cyanosis or clubbing. Skin: No rashes, no petechiae. Psychiatry: Judgement and insight appear normal. Mood & affect appropriate.    Data Reviewed: I have personally reviewed following labs and  imaging studies  Basic Metabolic Panel: Recent Labs  Lab 05/06/20 0348 05/07/20 0440 05/08/20 0344 05/10/20 0646 05/11/20 0722  NA 142 141 139 139 139  K 4.9 4.8 5.0 4.4 4.5  CL 100 101 102 98 100  CO2 31 30 28 30 30   GLUCOSE 161* 164* 185* 111* 117*  BUN 41* 43* 40* 33* 29*  CREATININE 1.03* 0.94 0.86 0.80 0.76  CALCIUM 8.1* 8.2* 8.2* 8.4* 7.9*   Liver Function Tests: Recent Labs  Lab 05/06/20 0348 05/07/20 0440 05/08/20 0344 05/10/20 0646 05/11/20 0722  AST 32 33 26 33 37  ALT 35 36 35 29 27  ALKPHOS 123 119 113 86 70  BILITOT 0.7 0.5 0.6 0.8 0.7  PROT 6.9 6.7 6.3* 6.0* 5.6*  ALBUMIN 2.4* 2.4* 2.3* 2.3* 2.1*   CBC: Recent Labs  Lab 05/05/20 0500 05/06/20 0348  05/07/20 0440 05/08/20 0344  WBC 18.5* 18.0* 22.5* 20.8*  HGB 14.5 14.1 14.4 13.8  HCT 46.2* 45.6 46.4* 44.7  MCV 92.4 92.7 93.9 92.5  PLT 368 386 422* 408*   Sepsis Labs:  No results found for this or any previous visit (from the past 240 hour(s)).   Scheduled Meds: . vitamin C  500 mg Oral Daily  . baricitinib  4 mg Oral Daily  . Chlorhexidine Gluconate Cloth  6 each Topical Daily  . dextromethorphan-guaiFENesin  1 tablet Oral BID  . enoxaparin (LOVENOX) injection  70 mg Subcutaneous Q12H  . feeding supplement (ENSURE ENLIVE)  237 mL Oral TID BM  . predniSONE  50 mg Oral Q breakfast  . zinc sulfate  220 mg Oral Daily   Continuous Infusions:  Procedures/Studies: 05/10/20 Venous Img Lower Bilateral (DVT)  Result Date: 05/06/2020 CLINICAL DATA:  Positive D-dimer EXAM: BILATERAL LOWER EXTREMITY VENOUS DOPPLER ULTRASOUND TECHNIQUE: Gray-scale sonography with graded compression, as well as color Doppler and duplex ultrasound were performed to evaluate the lower extremity deep venous systems from the level of the common femoral vein and including the common femoral, femoral, profunda femoral, popliteal and calf veins including the posterior tibial, peroneal and gastrocnemius veins when visible. The superficial great saphenous vein was also interrogated. Spectral Doppler was utilized to evaluate flow at rest and with distal augmentation maneuvers in the common femoral, femoral and popliteal veins. COMPARISON:  None. FINDINGS: Very limited exam because of obese body habitus. RIGHT LOWER EXTREMITY Common Femoral Vein: No evidence of thrombus. Normal compressibility, respiratory phasicity and response to augmentation. Saphenofemoral Junction: No evidence of thrombus. Normal compressibility and flow on color Doppler imaging. Profunda Femoral Vein: No evidence of thrombus. Normal compressibility and flow on color Doppler imaging. Femoral Vein: No evidence of thrombus. Normal compressibility, respiratory  phasicity and response to augmentation. Popliteal Vein: No evidence of thrombus. Normal compressibility, respiratory phasicity and response to augmentation. Calf Veins: No evidence of thrombus. Normal compressibility and flow on color Doppler imaging. LEFT LOWER EXTREMITY Common Femoral Vein: No evidence of thrombus. Normal compressibility, respiratory phasicity and response to augmentation. Saphenofemoral Junction: No evidence of thrombus. Normal compressibility and flow on color Doppler imaging. Profunda Femoral Vein: No evidence of thrombus. Normal compressibility and flow on color Doppler imaging. Femoral Vein: No evidence of thrombus. Normal compressibility, respiratory phasicity and response to augmentation. Popliteal Vein: No evidence of thrombus. Normal compressibility, respiratory phasicity and response to augmentation. Calf Veins: No evidence of thrombus. Normal compressibility and flow on color Doppler imaging. IMPRESSION: Very limited exam because of body habitus. No significant occlusive or acute DVT visualized in either  extremity. Electronically Signed   By: Judie Petit.  Shick M.D.   On: 05/06/2020 12:04   DG Chest Port 1 View  Result Date: 05/13/2020 CLINICAL DATA:  Shortness of breath. EXAM: PORTABLE CHEST 1 VIEW COMPARISON:  None. FINDINGS: Mild cardiomegaly is noted. No pneumothorax is noted. Multiple small ill-defined opacities are noted throughout both lungs concerning for possible multifocal pneumonia. Left pleural effusion cannot be excluded. Bony thorax is unremarkable. IMPRESSION: Multiple small ill-defined opacities are noted throughout both lungs concerning for possible multifocal pneumonia. Left pleural effusion cannot be excluded. Electronically Signed   By: Lupita Raider M.D.   On: 05/04/2020 19:42   Ravina Milner Mariea Clonts, MD  [facility]  If 7PM-7AM, please contact night-coverage [email or web address]  05/11/2020, 9:31 AM   LOS: 14 days

## 2020-05-12 DIAGNOSIS — F419 Anxiety disorder, unspecified: Secondary | ICD-10-CM

## 2020-05-12 LAB — BASIC METABOLIC PANEL
Anion gap: 9 (ref 5–15)
BUN: 26 mg/dL — ABNORMAL HIGH (ref 6–20)
CO2: 29 mmol/L (ref 22–32)
Calcium: 7.8 mg/dL — ABNORMAL LOW (ref 8.9–10.3)
Chloride: 100 mmol/L (ref 98–111)
Creatinine, Ser: 0.8 mg/dL (ref 0.44–1.00)
GFR calc Af Amer: >60 mL/min (ref >60)
GFR calc non Af Amer: >60 mL/min (ref >60)
Glucose, Bld: 136 mg/dL — ABNORMAL HIGH (ref 70–99)
Potassium: 4.1 mmol/L (ref 3.5–5.1)
Sodium: 138 mmol/L (ref 135–145)

## 2020-05-12 LAB — CBC
HCT: 41.3 % (ref 36.0–46.0)
Hemoglobin: 12.9 g/dL (ref 12.0–15.0)
MCH: 29.5 pg (ref 26.0–34.0)
MCHC: 31.2 g/dL (ref 30.0–36.0)
MCV: 94.3 fL (ref 80.0–100.0)
Platelets: 257 10*3/uL (ref 150–400)
RBC: 4.38 MIL/uL (ref 3.87–5.11)
RDW: 14.1 % (ref 11.5–15.5)
WBC: 15.7 10*3/uL — ABNORMAL HIGH (ref 4.0–10.5)
nRBC: 0 % (ref 0.0–0.2)

## 2020-05-12 LAB — HEPARIN ANTI-XA: Heparin LMW: 0.35 IU/mL

## 2020-05-12 MED ORDER — ENSURE ENLIVE PO LIQD
237.0000 mL | Freq: Two times a day (BID) | ORAL | Status: DC
  Administered 2020-05-13 – 2020-05-14 (×3): 237 mL via ORAL

## 2020-05-12 MED ORDER — JUVEN PO PACK
1.0000 | PACK | Freq: Two times a day (BID) | ORAL | Status: DC
  Administered 2020-05-12 – 2020-05-14 (×5): 1 via ORAL
  Filled 2020-05-12 (×3): qty 1

## 2020-05-12 MED ORDER — ENOXAPARIN SODIUM 80 MG/0.8ML ~~LOC~~ SOLN
80.0000 mg | Freq: Two times a day (BID) | SUBCUTANEOUS | Status: DC
  Administered 2020-05-12 – 2020-05-15 (×6): 80 mg via SUBCUTANEOUS
  Filled 2020-05-12 (×7): qty 0.8

## 2020-05-12 NOTE — Progress Notes (Signed)
ANTICOAGULATION CONSULT NOTE -  Pharmacy Consult for Lovenox Indication: VTE tx  Allergies  Allergen Reactions  . Penicillins     Did it involve swelling of the face/tongue/throat, SOB, or low BP? yes Did it involve sudden or severe rash/hives, skin peeling, or any reaction on the inside of your mouth or nose? yes Did you need to seek medical attention at a hospital or doctor's office? no When did it last happen?"years ago" If all above answers are "NO", may proceed with cephalosporin use.     Patient Measurements: Height: 5\' 8"  (172.7 cm) Weight: (!) 187.8 kg (414 lb 0.4 oz) IBW/kg (Calculated) : 63.9 BMI 63%  Vital Signs: Temp: 98.9 F (37.2 C) (09/28 0400) Temp Source: Axillary (09/28 0400) BP: 100/53 (09/28 0900)  Labs: Recent Labs    05/10/20 0646 05/11/20 0722 05/12/20 0535  HGB  --   --  12.9  HCT  --   --  41.3  PLT  --   --  257  HEPRLOWMOCWT  --   --  0.35  CREATININE 0.80 0.76 0.80    Estimated Creatinine Clearance: 135.7 mL/min (by C-G formula based on SCr of 0.8 mg/dL).     Assessment: 59 year old female with no documented chronic medical problems presenting with 1 week history of nonproductive cough, poor oral intake, shortness of breath, myalgias, and headache with associated loose stools.  The patient states that in the past 2 days her coughing and shortness of breath and generalized weakness had worsened.  In addition, she has lost her sense of smell. She was diagnosed with acute respiratory fx secondary to COVID-19 PNA. . With worsening breathing and increased D-Dimer, MD changed lovenox px to treatment regimen.     Anti-Xa level: 0.35- previous lovenox dose given 2 hours late so lab was drawn only 2.5 hours after dose givenc CBC: remains WNL  Goal of Therapy:  Anti-Xa level 0.6-1 units/ml 4hrs after LMWH dose given Monitor platelets by anticoagulation protocol: Yes   Plan:  Increase lovenox to 80 mg subq twice daily Monitor for S/S  of bleeding, anti-xa levels at steady state  46, PharmD Clinical Pharmacist 05/12/2020 9:26 AM

## 2020-05-12 NOTE — Progress Notes (Addendum)
Nutrition Follow-up  DOCUMENTATION CODES:   Morbid obesity  INTERVENTION:   -Decrease Ensure Enlive po to BID, each supplement provides 350 kcal and 20 grams of protein   -d/c Magic cup   -JUVEN BID to support wound healing   NUTRITION DIAGNOSIS:   Inadequate oral intake related to acute illness (COVID-19/pneumonia) as evidenced by estimated needs, meal completion < 25%.  Progressing with improved po intake.   GOAL:  Provide needs based on ASPEN/SCCM guidelines   Ongoing  MONITOR:  Supplement acceptance, PO intake, Labs, I & O's, Weight trends, TF tolerance   REASON FOR ASSESSMENT:   LOS    ASSESSMENT: Patient is a 59 yo female presented on 9/13-with poor oral intake, shortness of breath, generalized weakness. Acute respiratory failure related to COVID-19/ pneumonia.   Discussed meal intake with nursing and MD. Patient is not currently meeting estimated needs which have now increased as she is > 5 days in ICU with COVID. Recommend offer high calorie /high protein oral supplement TID. Consider enteral feeding if patient is amiable to have alternate source of nutrition support.  Per chart review: Pt intake of one meal since admission-25%. On 9/22 pt documented 75% lunch and 100% dinner. She was also up in chair and tolerated well per nursing.   9/24-No meal intake documented the past 2 days. Talked with her nurse who reports 100% of breakfast today and 100% of Ensure this morning. Patient appetite and intake have significantly improved.  9/28-Patient has stage 2 on bilateral buttocks- foley placed. Persisting severe hypoxia per MD requiring 45 liters high flow and non rebreather. Discussed with nursing and in rounds this morning. Patient is continuing to eat well despite her acute illness. Nursing reports 75% of all meals and 2 Ensure supplements daily.  Medications reviewed and include: Vitamin C, Zinc sulfate.  Drips: Methylprednisolone    Intake/Output Summary  (Last 24 hours) at 05/12/2020 1437 Last data filed at 05/12/2020 0500 Gross per 24 hour  Intake --  Output 1125 ml  Net -1125 ml  9/13- Admit weight: 158.8 kg    Labs: Reviewed BMP Latest Ref Rng & Units 05/12/2020 05/11/2020 05/10/2020  Glucose 70 - 99 mg/dL 825(K) 539(J) 673(A)  BUN 6 - 20 mg/dL 19(F) 79(K) 24(O)  Creatinine 0.44 - 1.00 mg/dL 9.73 5.32 9.92  Sodium 135 - 145 mmol/L 138 139 139  Potassium 3.5 - 5.1 mmol/L 4.1 4.5 4.4  Chloride 98 - 111 mmol/L 100 100 98  CO2 22 - 32 mmol/L 29 30 30   Calcium 8.9 - 10.3 mg/dL 7.8(L) 7.9(L) 8.4(L)     NUTRITION - FOCUSED PHYSICAL EXAM: deferred  Diet Order:   Diet Order            Diet regular Room service appropriate? Yes; Fluid consistency: Thin  Diet effective now                 EDUCATION NEEDS:   Not appropriate for education at this time   Skin:  Skin Assessment: Reviewed RN Assessment (2+ pitting edema BLE). Developed stage 2 PI bilateral buttocks  Last BM:  9/26  Height:   Ht Readings from Last 1 Encounters:  05/04/20 5\' 8"  (1.727 m)    Weight:   Wt Readings from Last 1 Encounters:  05/12/20 (!) 187.8 kg    Ideal Body Weight:   64 kg  BMI:  Body mass index is 62.95 kg/m.  Estimated Nutritional Needs:   Kcal:  2400 (adjusted bw) ICU > 5 days  Protein:  125-146 gr  Fluid:  >2400 ml daily   Royann Shivers MS,RD,CSG,LDN Pager: Loretha Stapler

## 2020-05-12 NOTE — Progress Notes (Signed)
Palliative:  HPI: 59 y.o. female  with past medical history of obesity and arthritis admitted on 04/20/2020 with one week history of nonproductive cough, poor oral intake, shortness of breath, myalgias, loose stools and headache. Unvaccinated against COVID-19. Went to urgent care on 9/13 and found to have oxygen saturations in the 60's, sent to ED. Hospital admission for acute respiratory failure secondary to COVID-19 pneumonia. Patient has expressed her wishes for DNR/DNI to multiple members of the care team including Dr. Roderic Palau. Receiving HFNC and NRB. Aggressive medical management. Palliative medicine consultation for goals of care.   I met today with Lisa Diaz. She is extremely pleasant. At time of my visit she is on 45L 100 FiO2 and NRB. She is able to speak with me without severe shortness of breath but with frequent dry cough with conversation. She tells me that her breathing is well controlled with current oxygen levels. When I ask how she is doing overall she tells me that she feels like she is a little better and still motivated to try and get better. She still has appetite and eats and when she doesn't eat much or care for the food she supplements with Ensure. She denies pain.   We did discuss goals of care and she tells me that she continues to be hopeful for improvement. She continue to endorse desire for DNR status. She is still hopeful but also at peace. She is extremely appreciative of the kindness of the staff and the care she is receiving. I did tell her that palliative will continue to support her. I explained that I am happy to hear that she is still in good spirits because if I came in and she told me that she was miserable and does not feel like anything we are doing is helping her to get where she needs to be then that would be a different conversation. She understands.   Plan: - Anticipatory anxiety: Continue hydroxyzine as needed. Consider adding SSRI/SNRI as well but this could take  time to become effective. May consider addition of propranolol 10 mg as needed as well.  - DNR maintained and wishes to continue with current aggressive care.   [place], NP Palliative Medicine Team Pager [phone] (Please see [email or web address] for schedule) Team Phone [phone]    Greater than 50%  of this time was spent counseling and coordinating care related to the above assessment and plan

## 2020-05-12 NOTE — Progress Notes (Signed)
PROGRESS NOTE  Lisa Diaz AVW:[id number] DOB: 08/21/1960 DOA: June 03, 2020 PCP: Patient, No Pcp Per  Brief History:  59 year old female with no documented chronic medical problems presenting with 1 week history of nonproductive cough, poor oral intake, shortness of breath, myalgias, and headache with associated loose stools.  The patient states that in the past 2 days her coughing and shortness of breath and generalized weakness had worsened.  In addition, she has lost her sense of smell.  The patient has been complaining of loose stools for the past 3 days.  She has had 3-4 loose bowel movements daily.  She denies any hematochezia but complains of some black stool.  She states that she has been taking Pepto-Bismol.  The patient has been having some subjective fevers and chills.  She has not been vaccinated against COVID-19.  She lives with her foster daughter who had some type of viral infection 2 weeks prior to the patient's admission.  The patient went to urgent care on June 03, 2020.  She was noted to have oxygen saturation in the 60s.  She was sent to emergency department for further evaluation. In the emergency department, the patient had temperature up to 102.9 F.  She was hemodynamically stable.  Oxygen saturation was 88-90% on a nonrebreather.  The patient was started on Solu-Medrol and remdesivir. - Severe/Significant hypoxia requiring a combination of nonrebreather bag AND high flow nasal cannula to keep O2 sats close to 90 even at rest  Assessment/Plan:  1)Acute respiratory failure secondary to COVID-19 pneumonia. -Completed IV Solu-Medrol x 10 days on 05/08/20 -Prednisone for 4 days started on 05/08/2020 -She completed Remdesivir on 05/01/20 -Continue the use of Vitamin C and zinc -Continue incentive respirometer -Patient unable to do prone positioning, advised to avoid laying on her back, she will try and lay on her side -continue baricitinib day 12 of  14 days) -CRP  22.1>17.7>11.0>16.1>3.7>2.8>> 0.6 -Ferritin 1457>3,063>3160>1341>1220>755>>640 -D-dimer 1.70>2.25>4.32>>20>>20>13.6>> 10.27 -PCT 0.27 05/12/20- -Severe/profound hypoxia persist even at Rest  45L heated high flow at 100% AND the non rebreather and she is sating 90% -Discussed with Dr Cyril MourningAlva Rakesh ---and Discussed with Resp Therapist -oxygen delivery systems and  parameters adjusted  -Plan of care, advanced directives and goals of care discussed with patient patient request no intubation, patient request DNR/DNI, patient remains full scope of treatment otherwise -can consider Bipap if patient's shortness of breath worsens, although this may be of limited benefit -with D dimer >20 initially , Lovenox was changed to therapeutic dosing; moving forward will treat for 1 month given patient's high risk for micro emboli. -Follow-up lower extremity venous Dopplers-initial Doppler studies were poor quality due to patient's body habitus -Prior to discharge we will attempt CTA of the chest to rule out PE.---Patient is too hypoxic and too medically unstable at this time to lie flat for CTA chest   2)AKI -No prior values to compare -Presented with serum creatinine 1.36 -AKI resolved with hydration   3)Morbid obesity -BMI 62 -At risk for increased morbidity and mortality from COVID-19 -lifestyle modification, portion control, low calorie diet and increase physical activity discussed with patient-after resolution of acute illness -Once medically stabilized and discharge will benefit of following with bariatric clinic  4)Social/Ethics- --Plan of care, advanced directives and goals of care discussed with patient patient request no intubation, patient request DNR/DNI, patient remains full scope of treatment otherwise -On multiple occasions, she affirmed that she would not want to receive CPR or to  be placed on a ventilator -She understands that if her respiratory condition continues to deteriorate then she  may not survive this illness -She also correctly states that even with a ventilator, her recovery would not be certain -If she continues to worsen, then " when it is my time, it is my time" -If the situation were to arise, she wished to focus on comfort. -She has identified her sister Lisa Diaz as main point of contact, I am glad for [place] to update intermittently with plan of care. -palliative care following, continue current treatments  5)Generalized weakness and deconditioning----given prolonged illness with prolonged hypoxia and morbid obesity----patient will most likely need SNF rehab prior to being able to return home --Patient is too unstable at this time from a cardiopulmonary standpoint to allow for physical therapy evaluation  6)Decubitus Ulcers--- skin breakdown (stage 2 on Lt and Rt Buttocks= Not POA )--- Needs Foley to avoid further skin breakdown--  --Pt has Incontinence and skin breakdown----BMI 62, pt with severe Dyspnea and Severe hypoxia and poor Mobility  Status is: Inpatient  Remains inpatient appropriate because:Severe/significant hypoxia requiring a combination of nonrebreather bag AND high flow nasal cannula to keep O2 sats close to 90 even at rest  Dispo: The patient is from: Home              Anticipated d/c is to: Home              Anticipated d/c date is: > 3 days              Patient currently is not medically stable to d/c. Barriers to Dc--:-Severe/significant hypoxia requiring a combination of nonrebreather bag AND high flow nasal cannula to keep O2 sats close to 90 even at rest   Family Communication:   No family at bedside.  Discussed with her Sister Lisa Diaz over the phone  [phone]  Consultants:  Discussed with Dr. Cyril Mourning  Code Status: DNR, confirmed with patient  DVT Prophylaxis:   Therapeutic lovenox  Procedures: As Listed in Progress Note Above  Antibiotics: None  Subjective:  05/12/20- -Severe/profound hypoxia  persist even at Rest  45L heated high flow at 100% AND the non rebreather and she is sating 90% -Discussed with Dr Cyril Mourning ---and Discussed with Resp Therapist -oxygen delivery systems and  parameters adjusted  No fever  Or chills   Objective: Vitals:   05/12/20 0800 05/12/20 0809 05/12/20 0900 05/12/20 1000  BP: 114/62  (!) 100/53 (!) 111/55  Pulse:      Resp: (!) 21  (!) 24 (!) 31  Temp:      TempSrc:      SpO2:  92%    Weight:      Height:        Intake/Output Summary (Last 24 hours) at 05/12/2020 1056 Last data filed at 05/12/2020 0500 Gross per 24 hour  Intake --  Output 1125 ml  Net -1125 ml   Weight change:   Exam: General exam:  -Morbidly obese, conversational dyspnea HEENT- HFNC And NRB Respiratory system: Air movement is fair, scattered rhonchi noted, no wheezing  cardiovascular system:RRR. No murmurs, rubs, gallops.  Gastrointestinal system: +BS, increased truncal adiposity, NT, ND Central nervous system: Generalized weakness, alert and oriented. No focal neurological deficits. Extremities: No cyanosis or clubbing. Skin: -Stage II decubitus ulcers on buttocks  psychiatry: Judgement and insight appear normal. Mood & affect appropriate.    Data Reviewed:   Basic Metabolic Panel: Recent Labs  Lab 05/07/20  2585 05/08/20 0344 05/10/20 0646 05/11/20 0722 05/12/20 0535  NA 141 139 139 139 138  K 4.8 5.0 4.4 4.5 4.1  CL 101 102 98 100 100  CO2 30 28 30 30 29   GLUCOSE 164* 185* 111* 117* 136*  BUN 43* 40* 33* 29* 26*  CREATININE 0.94 0.86 0.80 0.76 0.80  CALCIUM 8.2* 8.2* 8.4* 7.9* 7.8*   Liver Function Tests: Recent Labs  Lab 05/06/20 0348 05/07/20 0440 05/08/20 0344 05/10/20 0646 05/11/20 0722  AST 32 33 26 33 37  ALT 35 36 35 29 27  ALKPHOS 123 119 113 86 70  BILITOT 0.7 0.5 0.6 0.8 0.7  PROT 6.9 6.7 6.3* 6.0* 5.6*  ALBUMIN 2.4* 2.4* 2.3* 2.3* 2.1*   CBC: Recent Labs  Lab 05/06/20 0348 05/07/20 0440 05/08/20 0344 05/12/20 0535   WBC 18.0* 22.5* 20.8* 15.7*  HGB 14.1 14.4 13.8 12.9  HCT 45.6 46.4* 44.7 41.3  MCV 92.7 93.9 92.5 94.3  PLT 386 422* 408* 257   Sepsis Labs:  No results found for this or any previous visit (from the past 240 hour(s)).   Scheduled Meds: . vitamin C  500 mg Oral Daily  . Chlorhexidine Gluconate Cloth  6 each Topical Daily  . dextromethorphan-guaiFENesin  1 tablet Oral BID  . enoxaparin (LOVENOX) injection  80 mg Subcutaneous Q12H  . feeding supplement (ENSURE ENLIVE)  237 mL Oral TID BM  . zinc sulfate  220 mg Oral Daily   Continuous Infusions:  Procedures/Studies: 05/14/20 Venous Img Lower Bilateral (DVT)  Result Date: 05/06/2020 CLINICAL DATA:  Positive D-dimer EXAM: BILATERAL LOWER EXTREMITY VENOUS DOPPLER ULTRASOUND TECHNIQUE: Gray-scale sonography with graded compression, as well as color Doppler and duplex ultrasound were performed to evaluate the lower extremity deep venous systems from the level of the common femoral vein and including the common femoral, femoral, profunda femoral, popliteal and calf veins including the posterior tibial, peroneal and gastrocnemius veins when visible. The superficial great saphenous vein was also interrogated. Spectral Doppler was utilized to evaluate flow at rest and with distal augmentation maneuvers in the common femoral, femoral and popliteal veins. COMPARISON:  None. FINDINGS: Very limited exam because of obese body habitus. RIGHT LOWER EXTREMITY Common Femoral Vein: No evidence of thrombus. Normal compressibility, respiratory phasicity and response to augmentation. Saphenofemoral Junction: No evidence of thrombus. Normal compressibility and flow on color Doppler imaging. Profunda Femoral Vein: No evidence of thrombus. Normal compressibility and flow on color Doppler imaging. Femoral Vein: No evidence of thrombus. Normal compressibility, respiratory phasicity and response to augmentation. Popliteal Vein: No evidence of thrombus. Normal compressibility,  respiratory phasicity and response to augmentation. Calf Veins: No evidence of thrombus. Normal compressibility and flow on color Doppler imaging. LEFT LOWER EXTREMITY Common Femoral Vein: No evidence of thrombus. Normal compressibility, respiratory phasicity and response to augmentation. Saphenofemoral Junction: No evidence of thrombus. Normal compressibility and flow on color Doppler imaging. Profunda Femoral Vein: No evidence of thrombus. Normal compressibility and flow on color Doppler imaging. Femoral Vein: No evidence of thrombus. Normal compressibility, respiratory phasicity and response to augmentation. Popliteal Vein: No evidence of thrombus. Normal compressibility, respiratory phasicity and response to augmentation. Calf Veins: No evidence of thrombus. Normal compressibility and flow on color Doppler imaging. IMPRESSION: Very limited exam because of body habitus. No significant occlusive or acute DVT visualized in either extremity. Electronically Signed   By: 05/08/2020.  Shick M.D.   On: 05/06/2020 12:04   DG Chest Port 1 View  Result Date: 25-May-2020 CLINICAL DATA:  Shortness of breath. EXAM: PORTABLE CHEST 1 VIEW COMPARISON:  None. FINDINGS: Mild cardiomegaly is noted. No pneumothorax is noted. Multiple small ill-defined opacities are noted throughout both lungs concerning for possible multifocal pneumonia. Left pleural effusion cannot be excluded. Bony thorax is unremarkable. IMPRESSION: Multiple small ill-defined opacities are noted throughout both lungs concerning for possible multifocal pneumonia. Left pleural effusion cannot be excluded. Electronically Signed   By: Lupita Raider M.D.   On: 05/14/2020 19:42   Anjana Cheek Mariea Clonts, MD  [facility]  If 7PM-7AM, please contact night-coverage [email or web address]  05/12/2020, 10:56 AM   LOS: 15 days

## 2020-05-13 LAB — GLUCOSE, CAPILLARY: Glucose-Capillary: 156 mg/dL — ABNORMAL HIGH (ref 70–99)

## 2020-05-13 NOTE — TOC Progression Note (Signed)
Transition of Care [facility]) - Progression Note    Patient Details  Name: XOEY WARMOTH MRN: [id number] Date of Birth: [birth year]  [facility]) CM/SW Contact  Annice Needy, LCSW Phone Number: 05/13/2020, 1:29 PM  Clinical Narrative:    TOC continues to follow patient. Not currently stable for discharge.     Expected Discharge Plan: Home/Self Care Barriers to Discharge: Continued Medical Work up  Expected Discharge Plan and Services Expected Discharge Plan: Home/Self Care In-house Referral: Clinical Social Work, Artist     Living arrangements for the past 2 months: Single Family Home                                       Social Determinants of Health (SDOH) Interventions    Readmission Risk Interventions No flowsheet data found.

## 2020-05-13 NOTE — Progress Notes (Signed)
PROGRESS NOTE  Lisa Diaz YTK:[id number] DOB: 1960-10-12 DOA: 05-10-2020 PCP: Patient, No Pcp Per  Brief History:  59 year old female with no documented chronic medical problems presenting with 1 week history of nonproductive cough, poor oral intake, shortness of breath, myalgias, and headache with associated loose stools.  The patient states that in the past 2 days her coughing and shortness of breath and generalized weakness had worsened.  In addition, she has lost her sense of smell.  The patient has been complaining of loose stools for the past 3 days.  She has had 3-4 loose bowel movements daily.  She denies any hematochezia but complains of some black stool.  She states that she has been taking Pepto-Bismol.  The patient has been having some subjective fevers and chills.  She has not been vaccinated against COVID-19.  She lives with her foster daughter who had some type of viral infection 2 weeks prior to the patient's admission.  The patient went to urgent care on 10-May-2020.  She was noted to have oxygen saturation in the 60s.  She was sent to emergency department for further evaluation. In the emergency department, the patient had temperature up to 102.9 F.  She was hemodynamically stable.  Oxygen saturation was 88-90% on a nonrebreather.  The patient was started on Solu-Medrol and remdesivir. - Severe/Significant hypoxia requiring a combination of nonrebreather bag AND high flow nasal cannula to keep O2 sats close to 90 even at rest  Assessment/Plan:  1)Acute respiratory failure secondary to COVID-19 pneumonia. -Completed IV Solu-Medrol x 10 days on 05/08/20 -Prednisone for 4 days started on 05/08/2020 -She completed Remdesivir on 05/01/20 -Continue the use of Vitamin C and zinc -Continue incentive respirometer -Patient unable to do prone positioning, advised to avoid laying on her back, she will try and lay on her side -continue baricitinib day 12 of  14 days) -CRP  22.1>17.7>11.0>16.1>3.7>2.8>> 0.6 -Ferritin 1457>3,063>3160>1341>1220>755>>640 -D-dimer 1.70>2.25>4.32>>20>>20>13.6>> 10.27 -PCT 0.27 05/13/20- -Severe/profound hypoxia persist even at Rest  45L heated high flow at 100% AND the non rebreather and she is sating 90% -Discussed with Dr Lisa Diaz ---and Discussed with Resp Therapist -oxygen delivery systems and  parameters adjusted -Unable to wean oxygen further down at this time  -Plan of care, advanced directives and goals of care discussed with patient patient request no intubation, patient request DNR/DNI, patient remains full scope of treatment otherwise -can consider Bipap if patient's shortness of breath worsens, although this may be of limited benefit -with D dimer >20 initially , Lovenox was changed to therapeutic dosing; moving forward will treat for 1 month given patient's high risk for micro emboli. -Follow-up lower extremity venous Dopplers-initial Doppler studies were poor quality due to patient's body habitus -Prior to discharge we will attempt CTA of the chest to rule out PE.---Patient is too hypoxic and too medically unstable at this time to lie flat for CTA chest   2)AKI -No prior values to compare -Presented with serum creatinine 1.36 -AKI resolved with hydration   3)Morbid obesity -BMI 62 -At risk for increased morbidity and mortality from COVID-19 -lifestyle modification, portion control, low calorie diet and increase physical activity discussed with patient-after resolution of acute illness -Once medically stabilized and discharge will benefit of following with bariatric clinic  4)Social/Ethics- --Plan of care, advanced directives and goals of care discussed with patient patient request no intubation, patient request DNR/DNI, patient remains full scope of treatment otherwise -On multiple occasions, she affirmed that  she would not want to receive CPR or to be placed on a ventilator -She understands that if her  respiratory condition continues to deteriorate then she may not survive this illness -She also correctly states that even with a ventilator, her recovery would not be certain -If she continues to worsen, then " when it is my time, it is my time" -If the situation were to arise, she wished to focus on comfort. -She has identified her sister Lisa Diaz as main point of contact, I am glad for [place] to update intermittently with plan of care. -palliative care following, continue current treatments  5)Generalized weakness and deconditioning----given prolonged illness with prolonged hypoxia and morbid obesity----patient will most likely need SNF rehab prior to being able to return home --Patient is too unstable at this time from a cardiopulmonary standpoint to allow for physical therapy evaluation  6)Decubitus Ulcers--- skin breakdown (stage 2 on Lt and Rt Buttocks= Not POA )--- Needs Foley to avoid further skin breakdown--  --Pt has Incontinence and skin breakdown----BMI 62, pt with severe Dyspnea and Severe hypoxia and poor Mobility -Foley placed 05/12/2020  Status is: Inpatient  Remains inpatient appropriate because:Severe/significant hypoxia requiring a combination of nonrebreather bag AND high flow nasal cannula to keep O2 sats close to 90 even at rest  Dispo: The patient is from: Home              Anticipated d/c is to: Home              Anticipated d/c date is: > 3 days              Patient currently is not medically stable to d/c. Barriers to Dc--:-Severe/significant hypoxia requiring a combination of nonrebreather bag AND high flow nasal cannula to keep O2 sats close to 90 even at rest   Family Communication:   No family at bedside.  Discussed with her Sister Lisa Diaz over the phone  [phone]  Consultants:  Discussed with Dr. Cyril Diaz  Code Status: DNR, confirmed with patient  DVT Prophylaxis:   Therapeutic lovenox  Procedures: As Listed in Progress Note  Above  Antibiotics: None  Subjective:  05/13/20- -Severe/profound hypoxia persist even at Rest  45L heated high flow at 100% AND the non rebreather and she is sating 90% -Discussed with Dr Lisa Diaz ---and Discussed with Resp Therapist -oxygen delivery systems and  parameters adjusted  --Unable to wean oxygen further down at this time  No fever  Or chills   Objective: Vitals:   05/13/20 0600 05/13/20 0745 05/13/20 0833 05/13/20 1120  BP: 132/62     Pulse:      Resp: (!) 26 (!) 30  (!) 28  Temp:  98.9 F (37.2 C)  98.8 F (37.1 C)  TempSrc:  Oral  Axillary  SpO2:   93%   Weight:      Height:        Intake/Output Summary (Last 24 hours) at 05/13/2020 1427 Last data filed at 05/13/2020 0500 Gross per 24 hour  Intake --  Output 1225 ml  Net -1225 ml   Weight change: -3 kg  Exam: General exam:  -Morbidly obese, conversational dyspnea HEENT- HFNC And NRB Respiratory system: Air movement is fair,  no wheezing  cardiovascular system:RRR. No murmurs, rubs, gallops.  Gastrointestinal system: +BS, increased truncal adiposity, NT, ND Central nervous system: Generalized weakness, alert and oriented. No New focal neurological deficits. Extremities: No cyanosis or clubbing, no pitting edema Skin: -Stage II decubitus  ulcers on buttocks  psychiatry: Judgement and insight appear normal. Mood & affect appropriate.    Data Reviewed:   Basic Metabolic Panel: Recent Labs  Lab 05/07/20 0440 05/08/20 0344 05/10/20 0646 05/11/20 0722 05/12/20 0535  NA 141 139 139 139 138  K 4.8 5.0 4.4 4.5 4.1  CL 101 102 98 100 100  CO2 30 28 30 30 29   GLUCOSE 164* 185* 111* 117* 136*  BUN 43* 40* 33* 29* 26*  CREATININE 0.94 0.86 0.80 0.76 0.80  CALCIUM 8.2* 8.2* 8.4* 7.9* 7.8*   Liver Function Tests: Recent Labs  Lab 05/07/20 0440 05/08/20 0344 05/10/20 0646 05/11/20 0722  AST 33 26 33 37  ALT 36 35 29 27  ALKPHOS 119 113 86 70  BILITOT 0.5 0.6 0.8 0.7  PROT 6.7 6.3* 6.0*  5.6*  ALBUMIN 2.4* 2.3* 2.3* 2.1*   CBC: Recent Labs  Lab 05/07/20 0440 05/08/20 0344 05/12/20 0535  WBC 22.5* 20.8* 15.7*  HGB 14.4 13.8 12.9  HCT 46.4* 44.7 41.3  MCV 93.9 92.5 94.3  PLT 422* 408* 257   Sepsis Labs:  No results found for this or any previous visit (from the past 240 hour(s)).   Scheduled Meds: . vitamin C  500 mg Oral Daily  . Chlorhexidine Gluconate Cloth  6 each Topical Daily  . dextromethorphan-guaiFENesin  1 tablet Oral BID  . enoxaparin (LOVENOX) injection  80 mg Subcutaneous Q12H  . feeding supplement (ENSURE ENLIVE)  237 mL Oral BID BM  . nutrition supplement (JUVEN)  1 packet Oral BID BM  . zinc sulfate  220 mg Oral Daily   Continuous Infusions:  Procedures/Studies: US Venous Img Lower Bilateral (DVT)  Result Date: 05/06/2020 CLINICAL DATA:  Positive D-dimer EXAM: BILATERAL LOWER EXTREMITY VENOUS DOPPLER ULTRASOUND TECHNIQUE: Gray-scale sonography with graded compression, as well as color Doppler and duplex ultrasound were performed to evaluate the lower extremity deep venous systems from the level of the common femoral vein and including the common femoral, femoral, profunda femoral, popliteal and calf veins including the posterior tibial, peroneal and gastrocnemius veins when visible. The superficial great saphenous vein was also interrogated. Spectral Doppler was utilized to evaluate flow at rest and with distal augmentation maneuvers in the common femoral, femoral and popliteal veins. COMPARISON:  None. FINDINGS: Very limited exam because of obese body habitus. RIGHT LOWER EXTREMITY Common Femoral Vein: No evidence of thrombus. Normal compressibility, respiratory phasicity and response to augmentation. Saphenofemoral Junction: No evidence of thrombus. Normal compressibility and flow on color Doppler imaging. Profunda Femoral Vein: No evidence of thrombus. Normal compressibility and flow on color Doppler imaging. Femoral Vein: No evidence of thrombus.  Normal compressibility, respiratory phasicity and response to augmentation. Popliteal Vein: No evidence of thrombus. Normal compressibility, respiratory phasicity and response to augmentation. Calf Veins: No evidence of thrombus. Normal compressibility and flow on color Doppler imaging. LEFT LOWER EXTREMITY Common Femoral Vein: No evidence of thrombus. Normal compressibility, respiratory phasicity and response to augmentation. Saphenofemoral Junction: No evidence of thrombus. Normal compressibility and flow on color Doppler imaging. Profunda Femoral Vein: No evidence of thrombus. Normal compressibility and flow on color Doppler imaging. Femoral Vein: No evidence of thrombus. Normal compressibility, respiratory phasicity and response to augmentation. Popliteal Vein: No evidence of thrombus. Normal compressibility, respiratory phasicity and response to augmentation. Calf Veins: No evidence of thrombus. Normal compressibility and flow on color Doppler imaging. IMPRESSION: Very limited exam because of body habitus. No significant occlusive or acute DVT visualized in either extremity. Electronically Signed  By: Osvaldo Shipper M.D.   On: 05/06/2020 12:04   DG Chest Port 1 View  Result Date: 04/29/2020 CLINICAL DATA:  Shortness of breath. EXAM: PORTABLE CHEST 1 VIEW COMPARISON:  None. FINDINGS: Mild cardiomegaly is noted. No pneumothorax is noted. Multiple small ill-defined opacities are noted throughout both lungs concerning for possible multifocal pneumonia. Left pleural effusion cannot be excluded. Bony thorax is unremarkable. IMPRESSION: Multiple small ill-defined opacities are noted throughout both lungs concerning for possible multifocal pneumonia. Left pleural effusion cannot be excluded. Electronically Signed   By: Lupita Raider M.D.   On: 04/29/20 19:42   Sanayah Munro Mariea Clonts, MD  [facility]  If 7PM-7AM, please contact night-coverage [email or web address]  05/13/2020, 2:27 PM   LOS: 16 days sdr

## 2020-05-14 DIAGNOSIS — Z515 Encounter for palliative care: Secondary | ICD-10-CM

## 2020-05-14 DIAGNOSIS — Z7189 Other specified counseling: Secondary | ICD-10-CM

## 2020-05-14 LAB — HEPARIN ANTI-XA: Heparin LMW: 0.62 IU/mL

## 2020-05-14 LAB — GLUCOSE, CAPILLARY
Glucose-Capillary: 150 mg/dL — ABNORMAL HIGH (ref 70–99)
Glucose-Capillary: 223 mg/dL — ABNORMAL HIGH (ref 70–99)
Glucose-Capillary: 137 mg/dL — ABNORMAL HIGH (ref 70–99)

## 2020-05-14 MED ORDER — MORPHINE 100MG IN NS 100ML (1MG/ML) PREMIX INFUSION
2.0000 mg/h | INTRAVENOUS | Status: DC
  Administered 2020-05-14 – 2020-05-17 (×3): 2 mg/h via INTRAVENOUS
  Filled 2020-05-14 (×5): qty 100

## 2020-05-14 MED ORDER — MORPHINE BOLUS VIA INFUSION
2.0000 mg | INTRAVENOUS | Status: DC | PRN
  Administered 2020-05-16 – 2020-05-17 (×5): 2 mg via INTRAVENOUS
  Filled 2020-05-14: qty 4

## 2020-05-14 MED ORDER — ONDANSETRON HCL 4 MG/2ML IJ SOLN: 4.0000 mg | INTRAMUSCULAR | Status: DC | PRN

## 2020-05-14 MED ORDER — ALPRAZOLAM 0.25 MG PO TABS: 0.2500 mg | ORAL_TABLET | Freq: Two times a day (BID) | ORAL | Status: DC | PRN

## 2020-05-14 MED ORDER — MORPHINE SULFATE (PF) 2 MG/ML IV SOLN
2.0000 mg | INTRAVENOUS | Status: DC | PRN
  Administered 2020-05-14 – 2020-05-17 (×3): 2 mg via INTRAVENOUS
  Filled 2020-05-14 (×3): qty 1

## 2020-05-14 MED ORDER — LORAZEPAM 2 MG/ML IJ SOLN
1.0000 mg | INTRAMUSCULAR | Status: DC | PRN
  Administered 2020-05-17 (×2): 1 mg via INTRAVENOUS
  Filled 2020-05-14 (×2): qty 1

## 2020-05-14 NOTE — Progress Notes (Addendum)
  59 year old unvaccinated morbidly obese female admitted with acute hypoxic respiratory failure secondary to COVID-19 pneumonia on 04/22/2020--  Severe/Significant hypoxia persisted despite   a combination of nonrebreather bag AND heated high flow nasal cannula   --Patient subsequently developed very very persistent hypoxia with O2 sats in the 40s and 50s --Patient insist on DNR/DNI status -palliative care input appreciated   -I called and updated patient's sister -Patient sister is currently at bedside -Chaplain at bedside -Respiratory therapist at bedside  -Patient remains unresponsive, sister request comfort care measures  --Anticipate in-hospital death  -Morphine as needed for comfort - -Total care time over 48 minutes  Shon Hale, MD

## 2020-05-14 NOTE — Progress Notes (Signed)
Palliative:  HPI: 59 y.o.femalewith past medical history of obesity and arthritisadmitted on 9/13/2021with one week history of nonproductive cough, poor oral intake, shortness of breath, myalgias, loose stools and headache.Unvaccinated against COVID-19. Went to urgent care on 9/13 and found to have oxygen saturations in the 60's, sent to ED. Hospital admission for acute respiratory failure secondary to COVID-19 pneumonia. Patient has expressed her wishes for DNR/DNI to multiple members of the care team including Dr. Roderic Palau. Receiving HFNC and NRB. Aggressive medical management. Palliative medicine consultation for goals of care.   I met again today with Lisa Diaz. Lisa Diaz appears worse today with increased fatigue and shortness of breath. She continues on HFNC + NRB. RN reports that she was been asking if she is dying. She does not ask me this today. She does tell me that she is "so tired." We discussed different avenues of how to provide her some relief with more liberalized medication to "take the edge off" or more focus on comfort to begin aggressive measures such as infusion to allow her maximum comfort and allow her to go be with God. We discussed that there is only so much that people can give physically even when they want to live so badly sometimes our physical bodies do not cooperate with these goals. I gave her permission that we would all understand if she desired focus on comfort as she has been through so much for so long. Letricia thinks a minute and then tells me that she doesn't want anyone to think that she is giving up and isn't ready for full comfort at this time. She does desire medication to help her rest more comfortably while hoping that she can wake up stronger tomorrow. We agreed to proceed with some low dose comfort medications which can be increased if needed and goals can be reassessed tomorrow.   All questions/concerns addressed. Emotional support provided. Discussed with RN and  Dr. Denton Brick.   Exam: Alert but with extreme fatigue. Increased work of breathing with tachypnea and accessory muscle use. Moderate distress. Abd soft. Moves all extremities.   Plan: - I feel like Lisa Diaz is nearing stage of desiring full comfort. I think she is tired. I think she doesn't want anyone to feel like she is giving up.  - Requested RN to give morphine to give some relief and to message me if this is ineffective and we can adjust dose as needed.  - Recommend to reassess desire for comfort care tomorrow.   Buchanan, NP Palliative Medicine Team Pager [phone] (Please see [email or web address] for schedule) Team Phone [phone]    Greater than 50%  of this time was spent counseling and coordinating care related to the above assessment and plan

## 2020-05-14 NOTE — Progress Notes (Signed)
ANTICOAGULATION CONSULT NOTE -  Pharmacy Consult for Lovenox Indication: VTE tx  Allergies  Allergen Reactions  . Penicillins     Did it involve swelling of the face/tongue/throat, SOB, or low BP? yes Did it involve sudden or severe rash/hives, skin peeling, or any reaction on the inside of your mouth or nose? yes Did you need to seek medical attention at a hospital or doctor's office? no When did it last happen?"years ago" If all above answers are "NO", may proceed with cephalosporin use.     Patient Measurements: Height: 5\' 8"  (172.7 cm) Weight: (!) 184.8 kg (407 lb 6.6 oz) IBW/kg (Calculated) : 63.9 BMI 63%  Vital Signs: Temp: 101.2 F (38.4 C) (09/30 1613) Temp Source: Axillary (09/30 1613) BP: 125/69 (09/30 1900) Pulse Rate: 141 (09/30 1900)  Labs: Recent Labs    05/12/20 0535 05/14/20 1925  HGB 12.9  --   HCT 41.3  --   PLT 257  --   HEPRLOWMOCWT 0.35 0.62  CREATININE 0.80  --     Estimated Creatinine Clearance: 134.2 mL/min (by C-G formula based on SCr of 0.8 mg/dL).     Assessment: 59 year old female with no documented chronic medical problems presenting with 1 week history of nonproductive cough, poor oral intake, shortness of breath, myalgias, and headache with associated loose stools.  The patient states that in the past 2 days her coughing and shortness of breath and generalized weakness had worsened.  In addition, she has lost her sense of smell. She was diagnosed with acute respiratory fx secondary to COVID-19 PNA. . With worsening breathing and increased D-Dimer, MD changed lovenox px to treatment regimen.    Anti-Xa level low end therapeutic s/p dose increase to 80 mg SQ q 12h.  Pt hypoxic on HFNC with DNR/DNI and comfort care measures initiated, palliative following.    Goal of Therapy:  Anti-Xa level 0.6-1 units/ml 4hrs after LMWH dose given Monitor platelets by anticoagulation protocol: Yes   Plan:  Continue lovenox 80 mg SQ every 12  hours F/u GOC Monitor renal function, s/s bleeding, anti-xa level as needed  46, PharmD Clinical Pharmacist ED Pharmacist Phone # [phone] 05/14/2020 8:02 PM

## 2020-05-14 NOTE — Progress Notes (Addendum)
Notified MD of decreased alertness, respiratory distress, HR 127-130's, BP 180/105. 3 RN's and RT at bedside. IV Morphine given for comfort, per verbal order from Dr. Mariea Clonts.

## 2020-05-14 NOTE — Progress Notes (Signed)
Palliative:  Notified by RN of acute decline. Sister at bedside and now comfort care. RN concerned that with symptom needs. Ordered morphine infusion to ensure comfort as Opie has suffered enough and deserves comfort at this time. Of note she has been clear that she is ready to meet God when He is ready for her.   Yong Channel, NP Palliative Medicine Team Pager [phone] (Please see [email or web address] for schedule) Team Phone [phone]

## 2020-05-15 LAB — CBC
HCT: 42.2 % (ref 36.0–46.0)
Hemoglobin: 13.1 g/dL (ref 12.0–15.0)
MCH: 29.2 pg (ref 26.0–34.0)
MCHC: 31 g/dL (ref 30.0–36.0)
MCV: 94.2 fL (ref 80.0–100.0)
Platelets: 223 10*3/uL (ref 150–400)
RBC: 4.48 MIL/uL (ref 3.87–5.11)
RDW: 14.4 % (ref 11.5–15.5)
WBC: 28.3 10*3/uL — ABNORMAL HIGH (ref 4.0–10.5)
nRBC: 0.1 % (ref 0.0–0.2)

## 2020-05-15 LAB — COMPREHENSIVE METABOLIC PANEL
ALT: 80 U/L — ABNORMAL HIGH (ref 0–44)
AST: 52 U/L — ABNORMAL HIGH (ref 15–41)
Albumin: 2.2 g/dL — ABNORMAL LOW (ref 3.5–5.0)
Alkaline Phosphatase: 97 U/L (ref 38–126)
Anion gap: 13 (ref 5–15)
BUN: 33 mg/dL — ABNORMAL HIGH (ref 6–20)
CO2: 28 mmol/L (ref 22–32)
Calcium: 8.2 mg/dL — ABNORMAL LOW (ref 8.9–10.3)
Chloride: 100 mmol/L (ref 98–111)
Creatinine, Ser: 1.71 mg/dL — ABNORMAL HIGH (ref 0.44–1.00)
GFR calc Af Amer: 37 mL/min — ABNORMAL LOW (ref >60)
GFR calc non Af Amer: 32 mL/min — ABNORMAL LOW (ref >60)
Glucose, Bld: 125 mg/dL — ABNORMAL HIGH (ref 70–99)
Potassium: 5 mmol/L (ref 3.5–5.1)
Sodium: 141 mmol/L (ref 135–145)
Total Bilirubin: 0.8 mg/dL (ref 0.3–1.2)
Total Protein: 6.9 g/dL (ref 6.5–8.1)

## 2020-05-15 LAB — GLUCOSE, CAPILLARY
Glucose-Capillary: 135 mg/dL — ABNORMAL HIGH (ref 70–99)
Glucose-Capillary: 116 mg/dL — ABNORMAL HIGH (ref 70–99)
Glucose-Capillary: 114 mg/dL — ABNORMAL HIGH (ref 70–99)

## 2020-05-15 NOTE — Progress Notes (Signed)
Upon evening rounds, asked patient if she wanted to be turned, repositioned, or have a fitted sheet placed underneath her and she declined. Stated she was comfortable and didn't want to be moved. Still on bi-pap, 100% fiO2, sat's ranging anywhere from 85-90%. Pt only responses to RN is really "yes, no, and can I have some water." Water given to patient on several occasions along with mouth care, tolerated well. Will continue to monitor.

## 2020-05-15 NOTE — Progress Notes (Addendum)
PROGRESS NOTE  Lisa Diaz YQM:[id number] DOB: 02-Jul-1961 DOA: 05-04-2020 PCP: Patient, No Pcp Per  Brief History:  59 year old female with no documented chronic medical problems presenting with 1 week history of nonproductive cough, poor oral intake, shortness of breath, myalgias, and headache with associated loose stools.  The patient states that in the past 2 days her coughing and shortness of breath and generalized weakness had worsened.  In addition, she has lost her sense of smell.  The patient has been complaining of loose stools for the past 3 days.  She has had 3-4 loose bowel movements daily.  She denies any hematochezia but complains of some black stool.  She states that she has been taking Pepto-Bismol.  The patient has been having some subjective fevers and chills.  She has not been vaccinated against COVID-19.  She lives with her foster daughter who had some type of viral infection 2 weeks prior to the patient's admission.  The patient went to urgent care on 05-04-20.  She was noted to have oxygen saturation in the 60s.  She was sent to emergency department for further evaluation. In the emergency department, the patient had temperature up to 102.9 F.  She was hemodynamically stable.  Oxygen saturation was 88-90% on a nonrebreather.  The patient was started on Solu-Medrol and remdesivir. - Severe/Significant hypoxia Persist -  Assessment/Plan:  1)Acute respiratory failure secondary to COVID-19 pneumonia. -Completed IV Solu-Medrol x 10 days on 05/08/20 -completed Prednisone for 4 days started on 05/08/2020 -She completed Remdesivir on 05/01/20 Completed-  baricitinib ---Decompensated significantly on 05/14/2020- On 05/15/20 Pt's Sister Lisa Diaz her today---  I called and updated her Brother--- Lisa Diaz , questions answered---  -Plan of care, advanced directives and goals of care discussed with patient repeatedly throughout the hospital stay patient  request no intubation, patient request DNR/DNI,  ---can consider Bipap if patient's shortness of breath worsens, although this may be of limited benefit -Otherwise patient and sister request focus on comfort -Patient currently receiving morphine drip -Currently receiving a trial of BiPAP, if no improvement over the next 24 to 48 hours--will need to address with patient and sister again about possibly stopping BiPAP -Patient's goal remains comfort - 2)Morbid obesity -BMI 62 -Contributing to much increased morbidity and mortality from COVID-19   4)Social/Ethics- --Plan of care, advanced directives and goals of care discussed with patient patient request no intubation, patient request DNR/DNI,  -On multiple occasions, she affirmed that she would not want to receive CPR or to be placed on a ventilator -She understands that if her respiratory condition continues to deteriorate then she may not survive this illness -She also correctly states that even with a ventilator, her recovery would not be certain -If she continues to worsen, then " when it is my time, it is my time" -If the situation were to arise, she wished to focus on comfort. -She has identified her sister Lisa Che as main point of contact,  -palliative care following,  -  5)Generalized weakness and deconditioning----given prolonged illness with prolonged hypoxia and morbid obesity----patient is essentially bedbound at this time  6)Decubitus Ulcers--- skin breakdown (stage 2 on Lt and Rt Buttocks= Not POA )--- Needs Foley to avoid further skin breakdown--  --Pt has Incontinence and skin breakdown----BMI 62, pt with severe Dyspnea and Severe hypoxia and poor Mobility -Foley placed 05/12/2020  Status is: Inpatient  Remains inpatient appropriate because:Severe/significant hypoxia requiring continuous BiPAP  Dispo: The  patient is from: Home              Anticipated d/c is to: anticipate in-hospital Death              Anticipated  d/c date is: 2 days              Patient currently is not medically stable to d/c. Barriers to Dc--:-Severe/significant hypoxia requiring continuous BiPAP  Family Communication:  Discussed with her Sister Lisa Diaz over the phone  [phone] and pt's brother on 05/15/20  Consultants:  Discussed with Dr. Cyril Mourning  Code Status: DNR, confirmed with patient  DVT Prophylaxis:   Comfort care  Procedures: As Listed in Progress Note Above  Antibiotics: None  Subjective:  05/15/20- -Placed patient on heated HFNC (at her request to come off BiPAP) of 100% 70L her SpO2 dropped to 65%. Patient returned to BiPAP of 26/8, rate 13 and 100% FiO2  with an SpO2 of 89%. No fever  Or chills   Pt's Sister Lisa Diaz her today---  --- I called and updated her Brother--- Lisa Diaz , questions answered---   Objective: Vitals:   05/15/20 1413 05/15/20 1500 05/15/20 1600 05/15/20 1700  BP: 128/83 (!) 123/97 (!) 142/96 (!) 137/97  Pulse: (!) 111 (!) 108 (!) 115 (!) 114  Resp: 12 13 13 11   Temp:      TempSrc:      SpO2: 92% (!) 86% (!) 88% 90%  Weight:      Height:        Intake/Output Summary (Last 24 hours) at 05/15/2020 1733 Last data filed at 05/15/2020 1536 Gross per 24 hour  Intake 45.63 ml  Output 1025 ml  Net -979.37 ml   Weight change:   Exam: General exam:  -Morbidly obese, lethargic  HEENT- Bipap Respiratory system: Diminished breath sounds cardiovascular system:RRR. No murmurs, rubs, gallops.  Gastrointestinal system: +BS, increased truncal adiposity, NT, ND Central nervous system: Generalized weakness,  No New focal neurological deficits. Extremities: No cyanosis or clubbing, no pitting edema Skin: -Stage II decubitus ulcers on buttocks  psychiatry: Lethargic GU-Foley in situ   Data Reviewed:   Basic Metabolic Panel: Recent Labs  Lab 05/10/20 0646 05/11/20 0722 05/12/20 0535 05/15/20 0453  NA 139 139 138 141  K 4.4 4.5 4.1  5.0  CL 98 100 100 100  CO2 30 30 29 28   GLUCOSE 111* 117* 136* 125*  BUN 33* 29* 26* 33*  CREATININE 0.80 0.76 0.80 1.71*  CALCIUM 8.4* 7.9* 7.8* 8.2*   Liver Function Tests: Recent Labs  Lab 05/10/20 0646 05/11/20 0722 05/15/20 0453  AST 33 37 52*  ALT 29 27 80*  ALKPHOS 86 70 97  BILITOT 0.8 0.7 0.8  PROT 6.0* 5.6* 6.9  ALBUMIN 2.3* 2.1* 2.2*   CBC: Recent Labs  Lab 05/12/20 0535 05/15/20 0453  WBC 15.7* 28.3*  HGB 12.9 13.1  HCT 41.3 42.2  MCV 94.3 94.2  PLT 257 223   Sepsis Labs:  No results found for this or any previous visit (from the past 240 hour(s)).   Scheduled Meds: . Chlorhexidine Gluconate Cloth  6 each Topical Daily  . enoxaparin (LOVENOX) injection  80 mg Subcutaneous Q12H   Continuous Infusions: . morphine 2 mg/hr (05/15/20 1248)   Procedures/Studies: 07/15/20 Venous Img Lower Bilateral (DVT)  Result Date: 05/06/2020 CLINICAL DATA:  Positive D-dimer EXAM: BILATERAL LOWER EXTREMITY VENOUS DOPPLER ULTRASOUND TECHNIQUE: Gray-scale sonography with graded compression, as well as color Doppler  and duplex ultrasound were performed to evaluate the lower extremity deep venous systems from the level of the common femoral vein and including the common femoral, femoral, profunda femoral, popliteal and calf veins including the posterior tibial, peroneal and gastrocnemius veins when visible. The superficial great saphenous vein was also interrogated. Spectral Doppler was utilized to evaluate flow at rest and with distal augmentation maneuvers in the common femoral, femoral and popliteal veins. COMPARISON:  None. FINDINGS: Very limited exam because of obese body habitus. RIGHT LOWER EXTREMITY Common Femoral Vein: No evidence of thrombus. Normal compressibility, respiratory phasicity and response to augmentation. Saphenofemoral Junction: No evidence of thrombus. Normal compressibility and flow on color Doppler imaging. Profunda Femoral Vein: No evidence of thrombus. Normal  compressibility and flow on color Doppler imaging. Femoral Vein: No evidence of thrombus. Normal compressibility, respiratory phasicity and response to augmentation. Popliteal Vein: No evidence of thrombus. Normal compressibility, respiratory phasicity and response to augmentation. Calf Veins: No evidence of thrombus. Normal compressibility and flow on color Doppler imaging. LEFT LOWER EXTREMITY Common Femoral Vein: No evidence of thrombus. Normal compressibility, respiratory phasicity and response to augmentation. Saphenofemoral Junction: No evidence of thrombus. Normal compressibility and flow on color Doppler imaging. Profunda Femoral Vein: No evidence of thrombus. Normal compressibility and flow on color Doppler imaging. Femoral Vein: No evidence of thrombus. Normal compressibility, respiratory phasicity and response to augmentation. Popliteal Vein: No evidence of thrombus. Normal compressibility, respiratory phasicity and response to augmentation. Calf Veins: No evidence of thrombus. Normal compressibility and flow on color Doppler imaging. IMPRESSION: Very limited exam because of body habitus. No significant occlusive or acute DVT visualized in either extremity. Electronically Signed   By: Judie Petit.  Shick M.D.   On: 05/06/2020 12:04   DG Chest Port 1 View  Result Date: 05/14/2020 CLINICAL DATA:  Shortness of breath. EXAM: PORTABLE CHEST 1 VIEW COMPARISON:  None. FINDINGS: Mild cardiomegaly is noted. No pneumothorax is noted. Multiple small ill-defined opacities are noted throughout both lungs concerning for possible multifocal pneumonia. Left pleural effusion cannot be excluded. Bony thorax is unremarkable. IMPRESSION: Multiple small ill-defined opacities are noted throughout both lungs concerning for possible multifocal pneumonia. Left pleural effusion cannot be excluded. Electronically Signed   By: Lupita Raider M.D.   On: 04/23/2020 19:42   Travonta Gill Mariea Clonts, MD  [facility]  If 7PM-7AM, please  contact night-coverage [email or web address]  05/15/2020, 5:33 PM   LOS: 18 days sdr

## 2020-05-15 NOTE — Progress Notes (Signed)
Placed patient on heated HFNC (at her request to come off BiPAP) of 100% 70L her SpO2 dropped to 65%. Patient returned to BiPAP of 26/8, rate 13 and 100% FiO2  with an SpO2 of 89%.

## 2020-05-15 DEATH — deceased

## 2020-05-16 DIAGNOSIS — J1282 Pneumonia due to coronavirus disease 2019: Secondary | ICD-10-CM

## 2020-05-16 DIAGNOSIS — Z66 Do not resuscitate: Secondary | ICD-10-CM

## 2020-05-16 DIAGNOSIS — U071 COVID-19: Principal | ICD-10-CM

## 2020-05-16 DIAGNOSIS — F419 Anxiety disorder, unspecified: Secondary | ICD-10-CM

## 2020-05-16 DIAGNOSIS — L8990 Pressure ulcer of unspecified site, unspecified stage: Secondary | ICD-10-CM | POA: Insufficient documentation

## 2020-05-16 DIAGNOSIS — J9600 Acute respiratory failure, unspecified whether with hypoxia or hypercapnia: Secondary | ICD-10-CM

## 2020-05-16 LAB — GLUCOSE, CAPILLARY
Glucose-Capillary: 129 mg/dL — ABNORMAL HIGH (ref 70–99)
Glucose-Capillary: 123 mg/dL — ABNORMAL HIGH (ref 70–99)

## 2020-05-16 NOTE — Progress Notes (Signed)
Bipap is a PRN order.  Patient is on Comfort measures and is currently on HFNC (salter) with NRB.  Will continue to monitor.

## 2020-05-16 NOTE — Progress Notes (Signed)
PROGRESS NOTE  Lisa Diaz:[id number] DOB: 10-04-1960 DOA: 04/30/2020 PCP: Patient, No Pcp Per  Brief History:  59 year old female with no documented chronic medical problems presenting with 1 week history of nonproductive cough, poor oral intake, shortness of breath, myalgias, and headache with associated loose stools.  The patient states that in the past 2 days her coughing and shortness of breath and generalized weakness had worsened.  In addition, she has lost her sense of smell.  The patient has been complaining of loose stools for the past 3 days.  She has had 3-4 loose bowel movements daily.  She denies any hematochezia but complains of some black stool.  She states that she has been taking Pepto-Bismol.  The patient has been having some subjective fevers and chills.  She has not been vaccinated against COVID-19.  She lives with her foster daughter who had some type of viral infection 2 weeks prior to the patient's admission.  The patient went to urgent care on 04/24/2020.  She was noted to have oxygen saturation in the 60s.  She was sent to emergency department for further evaluation. In the emergency department, the patient had temperature up to 102.9 F.  She was hemodynamically stable.  Oxygen saturation was 88-90% on a nonrebreather.  The patient was started on Solu-Medrol and remdesivir. - Severe/Significant hypoxia Persist -  Assessment/Plan:  1)Acute respiratory failure secondary to COVID-19 pneumonia. -Completed IV Solu-Medrol x 10 days on 05/08/20 -completed Prednisone for 4 days started on 05/08/2020 -She completed Remdesivir on 05/01/20 Completed-  baricitinib -Plan of care, advanced directives and goals of care discussed with patient repeatedly throughout the hospital stay patient request no intubation, patient request DNR/DNI,  -Patient currently receiving morphine drip -Patient is currently on BiPAP and has been for over 24 hours. -Discussed that BiPAP is  typically a temporary treatment modality -Patient is unable to eat/drink/ambulate/communicate while on continuous BiPAP -This appears to be having a significant impact on her quality of life -Discussed possibility of coming off BiPAP while continuing supplemental oxygen -Tachypneic can be managed with adjustment of morphine infusion -Main goal of therapy would be patient's comfort -Patient is agreeable at this point.  - 2)Morbid obesity -BMI 62 -Contributing to much increased morbidity and mortality from COVID-19   4)Social/Ethics- --Plan of care, advanced directives and goals of care discussed with patient patient request no intubation, patient request DNR/DNI,  -On multiple occasions, she affirmed that she would not want to receive CPR or to be placed on a ventilator -She understands that if her respiratory condition continues to deteriorate then she may not survive this illness -She also correctly states that even with a ventilator, her recovery would not be certain -If she continues to worsen, then " when it is my time, it is my time" -If the situation were to arise, she wished to focus on comfort. -She has identified her sister [name] as main point of contact,  -palliative care following,  -We discussed the role of BiPAP and how it is usually a temporary bridge and not a long-term continuous treatment -Patient wishes to come off of BiPAP today to see how she will perform -If her breathing begins to decline/she becomes tachypneic, she agrees to manage her tachypnea by adjustment of morphine infusion.  5)Generalized weakness and deconditioning----given prolonged illness with prolonged hypoxia and morbid obesity----patient is essentially bedbound at this time  6)Decubitus Ulcers--- skin breakdown (stage 2 on Lt and Rt Buttocks=  Not POA )--- Needs Foley to avoid further skin breakdown--  --Pt has Incontinence and skin breakdown----BMI 62, pt with severe Dyspnea and Severe hypoxia and  poor Mobility -Foley placed 05/12/2020  Status is: Inpatient  Remains inpatient appropriate because:Severe/significant hypoxia requiring continuous BiPAP  Dispo: The patient is from: Home              Anticipated d/c is to: anticipate in-hospital Death              Anticipated d/c date is: 2 days              Patient currently is not medically stable to d/c. Barriers to Dc--:-Severe/significant hypoxia requiring continuous BiPAP  Family Communication:  Discussed with [name] over the phone 10/2  Consultants:  Discussed with Dr. Cyril Mourning  Code Status: DNR, confirmed with patient  DVT Prophylaxis:   Comfort care  Procedures: As Listed in Progress Note Above  Antibiotics: None  Subjective: Patient was placed on BiPAP yesterday and overnight.  She remains on BiPAP during my visit today.  She complains of dry mouth.  She is more awake today than she was yesterday.  She requested have BiPAP removed.  Objective: Vitals:   05/16/20 1400 05/16/20 1500 05/16/20 1700 05/16/20 1800  BP: (!) 154/99     Pulse: (!) 112 (!) 118 (!) 113 (!) 122  Resp: 20 (!) 29 17 (!) 23  Temp:      TempSrc:      SpO2: 92% (!) 88% (!) 86%   Weight:      Height:        Intake/Output Summary (Last 24 hours) at 05/16/2020 1845 Last data filed at 05/16/2020 1800 Gross per 24 hour  Intake 71.25 ml  Output --  Net 71.25 ml   Weight change:   Exam: General exam: Alert, awake, oriented x 3 Respiratory system: Coarse breath sounds bilaterally Cardiovascular system:RRR. No murmurs, rubs, gallops. Gastrointestinal system: Abdomen is nondistended, soft and nontender. No organomegaly or masses felt. Normal bowel sounds heard. Central nervous system: Alert and oriented. No focal neurological deficits. Extremities: No C/C/E, +pedal pulses Skin: No rashes, lesions or ulcers Psychiatry: Judgement and insight appear normal. Mood & affect appropriate.     Data  Reviewed:   Basic Metabolic Panel: Recent Labs  Lab 05/10/20 0646 05/11/20 0722 05/12/20 0535 05/15/20 0453  NA 139 139 138 141  K 4.4 4.5 4.1 5.0  CL 98 100 100 100  CO2 30 30 29 28   GLUCOSE 111* 117* 136* 125*  BUN 33* 29* 26* 33*  CREATININE 0.80 0.76 0.80 1.71*  CALCIUM 8.4* 7.9* 7.8* 8.2*   Liver Function Tests: Recent Labs  Lab 05/10/20 0646 05/11/20 0722 05/15/20 0453  AST 33 37 52*  ALT 29 27 80*  ALKPHOS 86 70 97  BILITOT 0.8 0.7 0.8  PROT 6.0* 5.6* 6.9  ALBUMIN 2.3* 2.1* 2.2*   CBC: Recent Labs  Lab 05/12/20 0535 05/15/20 0453  WBC 15.7* 28.3*  HGB 12.9 13.1  HCT 41.3 42.2  MCV 94.3 94.2  PLT 257 223   Sepsis Labs:  No results found for this or any previous visit (from the past 240 hour(s)).   Scheduled Meds: . Chlorhexidine Gluconate Cloth  6 each Topical Daily  . enoxaparin (LOVENOX) injection  80 mg Subcutaneous Q12H   Continuous Infusions: . morphine 6 mg/hr (05/16/20 1800)   Procedures/Studies: 07/16/20 Venous Img Lower Bilateral (DVT)  Result Date: 05/06/2020 CLINICAL DATA:  Positive  D-dimer EXAM: BILATERAL LOWER EXTREMITY VENOUS DOPPLER ULTRASOUND TECHNIQUE: Gray-scale sonography with graded compression, as well as color Doppler and duplex ultrasound were performed to evaluate the lower extremity deep venous systems from the level of the common femoral vein and including the common femoral, femoral, profunda femoral, popliteal and calf veins including the posterior tibial, peroneal and gastrocnemius veins when visible. The superficial great saphenous vein was also interrogated. Spectral Doppler was utilized to evaluate flow at rest and with distal augmentation maneuvers in the common femoral, femoral and popliteal veins. COMPARISON:  None. FINDINGS: Very limited exam because of obese body habitus. RIGHT LOWER EXTREMITY Common Femoral Vein: No evidence of thrombus. Normal compressibility, respiratory phasicity and response to augmentation.  Saphenofemoral Junction: No evidence of thrombus. Normal compressibility and flow on color Doppler imaging. Profunda Femoral Vein: No evidence of thrombus. Normal compressibility and flow on color Doppler imaging. Femoral Vein: No evidence of thrombus. Normal compressibility, respiratory phasicity and response to augmentation. Popliteal Vein: No evidence of thrombus. Normal compressibility, respiratory phasicity and response to augmentation. Calf Veins: No evidence of thrombus. Normal compressibility and flow on color Doppler imaging. LEFT LOWER EXTREMITY Common Femoral Vein: No evidence of thrombus. Normal compressibility, respiratory phasicity and response to augmentation. Saphenofemoral Junction: No evidence of thrombus. Normal compressibility and flow on color Doppler imaging. Profunda Femoral Vein: No evidence of thrombus. Normal compressibility and flow on color Doppler imaging. Femoral Vein: No evidence of thrombus. Normal compressibility, respiratory phasicity and response to augmentation. Popliteal Vein: No evidence of thrombus. Normal compressibility, respiratory phasicity and response to augmentation. Calf Veins: No evidence of thrombus. Normal compressibility and flow on color Doppler imaging. IMPRESSION: Very limited exam because of body habitus. No significant occlusive or acute DVT visualized in either extremity. Electronically Signed   By: Judie Petit.  Shick M.D.   On: 05/06/2020 12:04   DG Chest Port 1 View  Result Date: 05/23/2020 CLINICAL DATA:  Shortness of breath. EXAM: PORTABLE CHEST 1 VIEW COMPARISON:  None. FINDINGS: Mild cardiomegaly is noted. No pneumothorax is noted. Multiple small ill-defined opacities are noted throughout both lungs concerning for possible multifocal pneumonia. Left pleural effusion cannot be excluded. Bony thorax is unremarkable. IMPRESSION: Multiple small ill-defined opacities are noted throughout both lungs concerning for possible multifocal pneumonia. Left pleural  effusion cannot be excluded. Electronically Signed   By: Lupita Raider M.D.   On: May 23, 2020 19:42   Erick Blinks, MD  [facility]  If 7PM-7AM, please contact night-coverage [email or web address]  05/16/2020, 6:45 PM   LOS: 19 days

## 2020-06-15 NOTE — Progress Notes (Signed)
Morphine drip stopped due to unavailability of medication at this time per [facility]. Will keep patient comfortable via IV push morphine.

## 2020-06-15 NOTE — Death Summary Note (Signed)
DEATH SUMMARY   Patient Details  Name: Lisa Diaz MRN: [id number] DOB: [birth year]  Admission/Discharge Information   Admit Date:  05/15/2020  Date of Death: Date of Death: 2020/06/04  Time of Death: Time of Death: 1626-12-12  Length of Stay: Dec 21, 2022  Referring Physician: Patient, No Pcp Per   Reason(s) for Hospitalization  Shortness of breath  Diagnoses  Preliminary cause of death: Acute respiratory failure with hypoxia due to COVID 19 pneumonia Secondary Diagnoses (including complications and co-morbidities):  Principal Problem:   Pneumonia due to COVID-19 virus Active Problems:   Acute respiratory failure with hypoxia (HCC)   Arthritis   Super obese   Elevated serum creatinine   Elevated BUN   SIRS (systemic inflammatory response syndrome) (HCC)   Diarrhea   Elevated AST (SGOT)   Dehydration   Obesity, Class III, BMI 40-49.9 (morbid obesity) (HCC)   Acute respiratory failure due to COVID-19 [facility])   DNR (do not resuscitate)   Palliative care by specialist   Goals of care, counseling/discussion   Anxiety   Pressure injury of skin   Brief Hospital Course (including significant findings, care, treatment, and services provided and events leading to death)  Lisa Diaz is a 59 y.o. year old female with a history of morbid obesity, who was unvaccincated against [phone], presented to the hospital with shortness of breath and found to have covid 19 pneumonia. Patient was treated with remdesivir, steroids and baracitinib. Since admission, she was requiring very high amounts of supplemental oxygen through heated high flow and NRB mask. She was intermittently on bipap as well with limited benefit. Despite aggressive measures, she did not seem to make any meaningful improvement. Several discussions around goals of care were had with patient and multiple different providers. Throughout here stay, she made here wishes clear that she did not want to be intubated and if she was not  improving, that she would want to have a natural death. She was seen by palliative care that also assisted with these conversations. After almost 3 weeks in the hospital, conversation was had with the patient regarding whether she wished to remove bipap to see how she would perform. She agreed to remove bipap and if she developed worsening shortness of breath, she elected to pursue comfort measures. After removing bipap, she did develop shortness of breath and was started on morphine infusion. This was titrated for respiratory distress. Her sister Lisa Diaz was the main support for patient and was supportive of her decision. On 06/05/2023 patient passed away in the hospital. Lisa Diaz was informed of her death and offered support.     Pertinent Labs and Studies  Significant Diagnostic Studies US Venous Img Lower Bilateral (DVT)  Result Date: 05/06/2020 CLINICAL DATA:  Positive D-dimer EXAM: BILATERAL LOWER EXTREMITY VENOUS DOPPLER ULTRASOUND TECHNIQUE: Gray-scale sonography with graded compression, as well as color Doppler and duplex ultrasound were performed to evaluate the lower extremity deep venous systems from the level of the common femoral vein and including the common femoral, femoral, profunda femoral, popliteal and calf veins including the posterior tibial, peroneal and gastrocnemius veins when visible. The superficial great saphenous vein was also interrogated. Spectral Doppler was utilized to evaluate flow at rest and with distal augmentation maneuvers in the common femoral, femoral and popliteal veins. COMPARISON:  None. FINDINGS: Very limited exam because of obese body habitus. RIGHT LOWER EXTREMITY Common Femoral Vein: No evidence of thrombus. Normal compressibility, respiratory phasicity and response to augmentation. Saphenofemoral Junction: No evidence of thrombus. Normal compressibility  and flow on color Doppler imaging. Profunda Femoral Vein: No evidence of thrombus. Normal compressibility and  flow on color Doppler imaging. Femoral Vein: No evidence of thrombus. Normal compressibility, respiratory phasicity and response to augmentation. Popliteal Vein: No evidence of thrombus. Normal compressibility, respiratory phasicity and response to augmentation. Calf Veins: No evidence of thrombus. Normal compressibility and flow on color Doppler imaging. LEFT LOWER EXTREMITY Common Femoral Vein: No evidence of thrombus. Normal compressibility, respiratory phasicity and response to augmentation. Saphenofemoral Junction: No evidence of thrombus. Normal compressibility and flow on color Doppler imaging. Profunda Femoral Vein: No evidence of thrombus. Normal compressibility and flow on color Doppler imaging. Femoral Vein: No evidence of thrombus. Normal compressibility, respiratory phasicity and response to augmentation. Popliteal Vein: No evidence of thrombus. Normal compressibility, respiratory phasicity and response to augmentation. Calf Veins: No evidence of thrombus. Normal compressibility and flow on color Doppler imaging. IMPRESSION: Very limited exam because of body habitus. No significant occlusive or acute DVT visualized in either extremity. Electronically Signed   By: Judie Petit.  Shick M.D.   On: 05/06/2020 12:04   DG Chest Port 1 View  Result Date: 05/01/2020 CLINICAL DATA:  Shortness of breath. EXAM: PORTABLE CHEST 1 VIEW COMPARISON:  None. FINDINGS: Mild cardiomegaly is noted. No pneumothorax is noted. Multiple small ill-defined opacities are noted throughout both lungs concerning for possible multifocal pneumonia. Left pleural effusion cannot be excluded. Bony thorax is unremarkable. IMPRESSION: Multiple small ill-defined opacities are noted throughout both lungs concerning for possible multifocal pneumonia. Left pleural effusion cannot be excluded. Electronically Signed   By: Lupita Raider M.D.   On: 04/25/2020 19:42    Microbiology No results found for this or any previous visit (from the past 240  hour(s)).  Lab Basic Metabolic Panel: Recent Labs  Lab 05/11/20 0722 05/12/20 0535 05/15/20 0453  NA 139 138 141  K 4.5 4.1 5.0  CL 100 100 100  CO2 30 29 28   GLUCOSE 117* 136* 125*  BUN 29* 26* 33*  CREATININE 0.76 0.80 1.71*  CALCIUM 7.9* 7.8* 8.2*   Liver Function Tests: Recent Labs  Lab 05/11/20 0722 05/15/20 0453  AST 37 52*  ALT 27 80*  ALKPHOS 70 97  BILITOT 0.7 0.8  PROT 5.6* 6.9  ALBUMIN 2.1* 2.2*   No results for input(s): LIPASE, AMYLASE in the last 168 hours. No results for input(s): AMMONIA in the last 168 hours. CBC: Recent Labs  Lab 05/12/20 0535 05/15/20 0453  WBC 15.7* 28.3*  HGB 12.9 13.1  HCT 41.3 42.2  MCV 94.3 94.2  PLT 257 223   Cardiac Enzymes: No results for input(s): CKTOTAL, CKMB, CKMBINDEX, TROPONINI in the last 168 hours. Sepsis Labs: Recent Labs  Lab 05/12/20 0535 05/15/20 0453  WBC 15.7* 28.3*    Procedures/Operations     07/15/20 06-02-2020, 8:15 PM

## 2020-06-15 NOTE — Progress Notes (Signed)
ANTICOAGULATION CONSULT NOTE -  Pharmacy Consult for Lovenox Indication: VTE tx  Allergies  Allergen Reactions  . Penicillins     Did it involve swelling of the face/tongue/throat, SOB, or low BP? yes Did it involve sudden or severe rash/hives, skin peeling, or any reaction on the inside of your mouth or nose? yes Did you need to seek medical attention at a hospital or doctor's office? no When did it last happen?"years ago" If all above answers are "NO", may proceed with cephalosporin use.     Patient Measurements: Height: 5\' 8"  (172.7 cm) Weight: (!) 184.8 kg (407 lb 6.6 oz) IBW/kg (Calculated) : 63.9 BMI 63%  Vital Signs: BP: 136/77 (10/03 0400)  Labs: Recent Labs    05/14/20 1925 05/15/20 0453  HGB  --  13.1  HCT  --  42.2  PLT  --  223  HEPRLOWMOCWT 0.62  --   CREATININE  --  1.71*    Estimated Creatinine Clearance: 62.8 mL/min (A) (by C-G formula based on SCr of 1.71 mg/dL (H)).     Assessment: 59 year old female with no documented chronic medical problems presenting with 1 week history of nonproductive cough, poor oral intake, shortness of breath, myalgias, and headache with associated loose stools.  The patient states that in the past 2 days her coughing and shortness of breath and generalized weakness had worsened.  In addition, she has lost her sense of smell. She was diagnosed with acute respiratory fx secondary to COVID-19 PNA. . With worsening breathing and increased D-Dimer, MD changed lovenox px to treatment regimen.    Anti-Xa level low end therapeutic s/p dose increase to 80 mg SQ q 12h.  Pt hypoxic on HFNC with DNR/DNI and comfort care measures initiated, palliative following.    Goal of Therapy:  Anti-Xa level 0.6-1 units/ml 4hrs after LMWH dose given Monitor platelets by anticoagulation protocol: Yes   Plan:  Continue lovenox 80 mg SQ every 12 hours F/u GOC Monitor renal function, s/s bleeding, anti-xa level as needed  46,  PharmD, MBA, BCGP Clinical Pharmacist  06/05/2020 9:38 AM

## 2020-06-15 NOTE — Progress Notes (Signed)
Wasted 140 mls of morphine in stericycle. One bag was new and full 100 mls. Second bag had 40 mls. Left after patient died. Osie Bond RN Jolly Mango RN witness

## 2020-06-15 DEATH — deceased

## 2021-04-09 IMAGING — US US EXTREM LOW VENOUS
1 series · 13 of 24 positions shown · non-contrast
Comparison: None.

CLINICAL DATA: Positive D-dimer



[Series 1: us venous img lower bilat (dvt) · portal-venous · 13 of 56 slices shown]
[im 1/56]
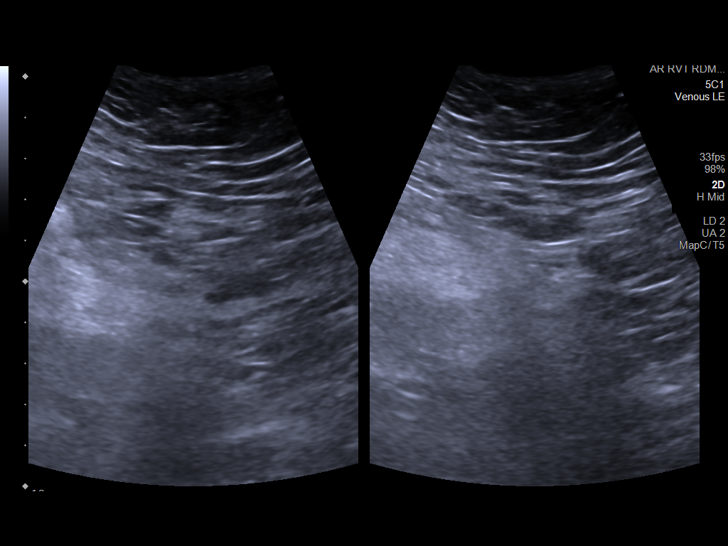
[im 5/56]
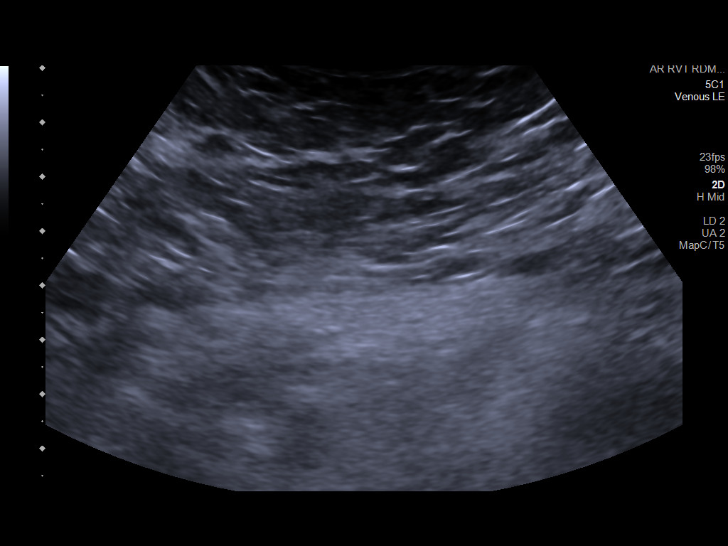
[im 10/56]
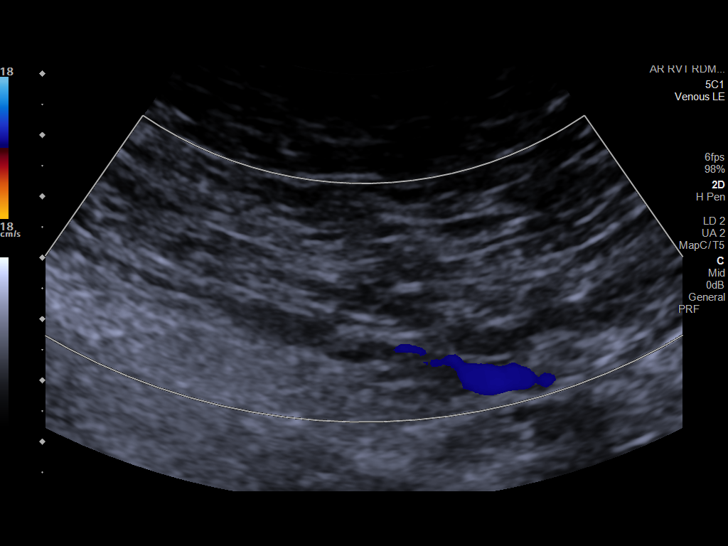
[im 15/56]
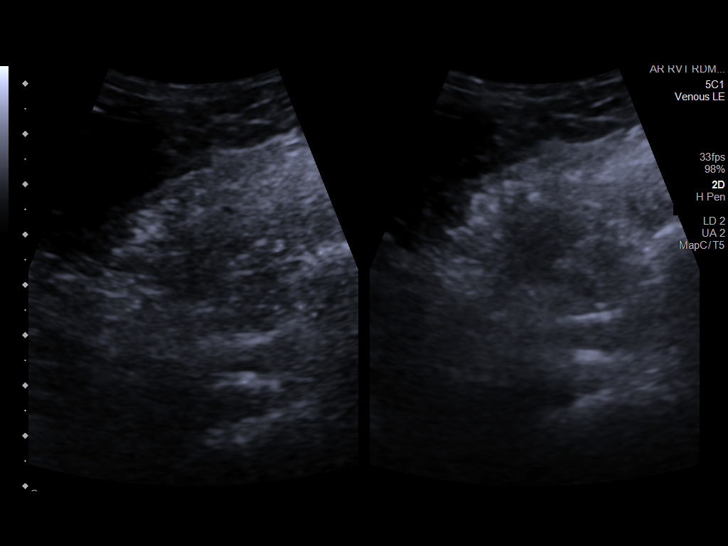
[im 20/56]
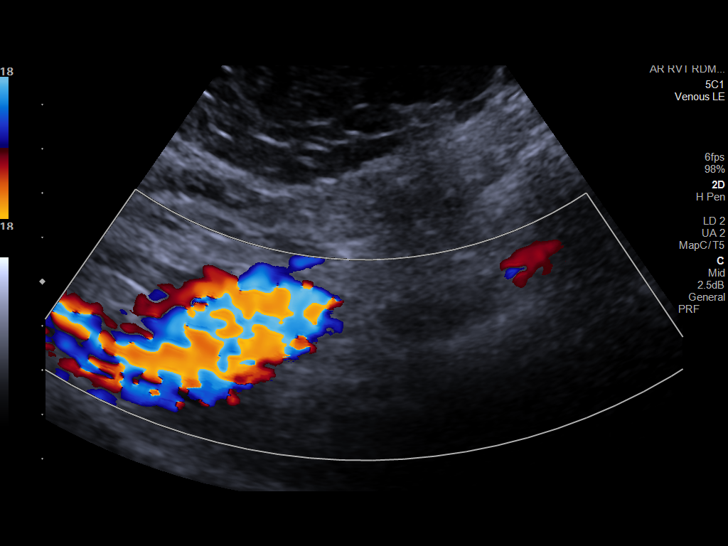
[im 24/56]
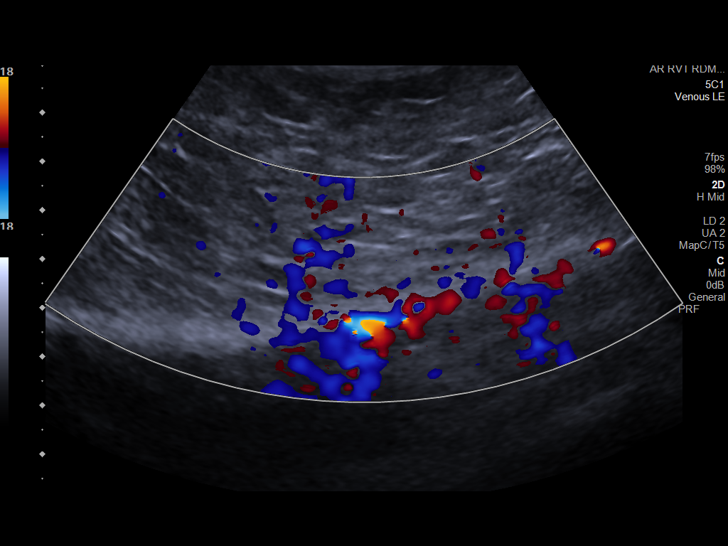
[im 29/56]
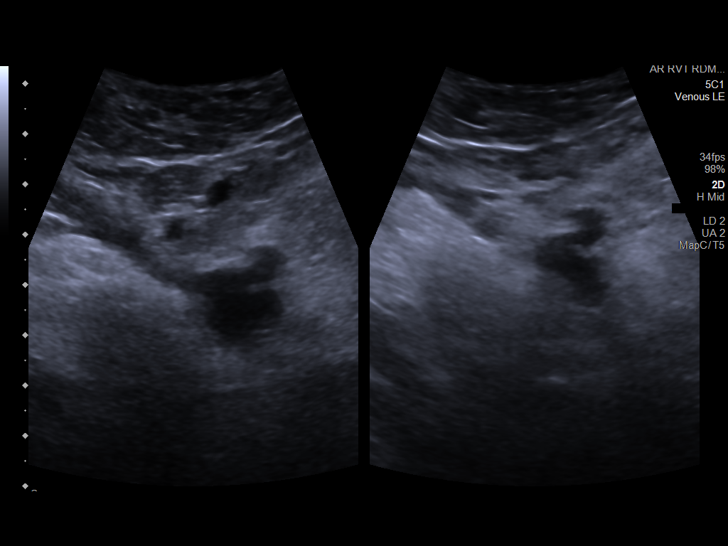
[im 32/56]
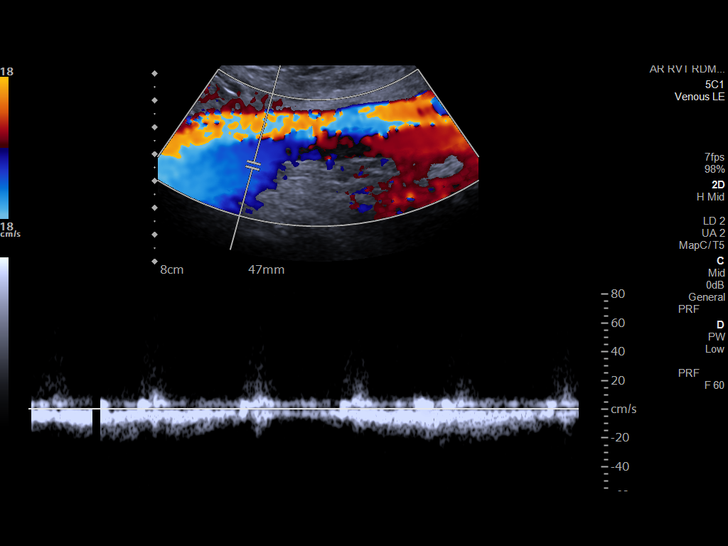
[im 36/56]
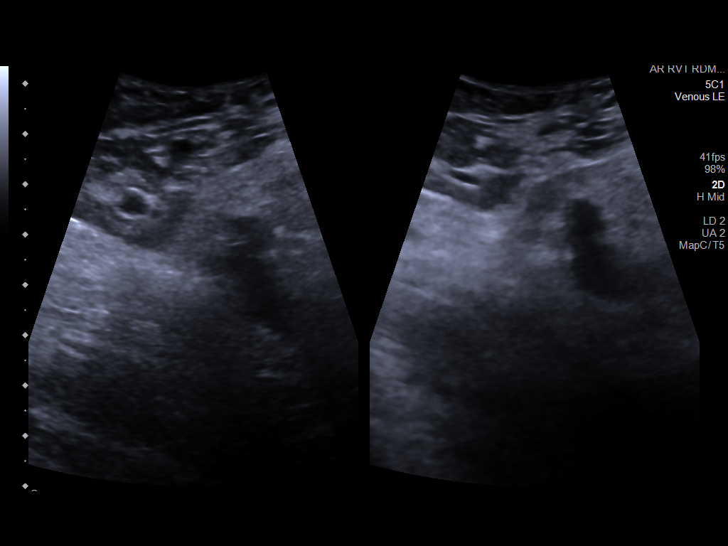
[im 41/56]
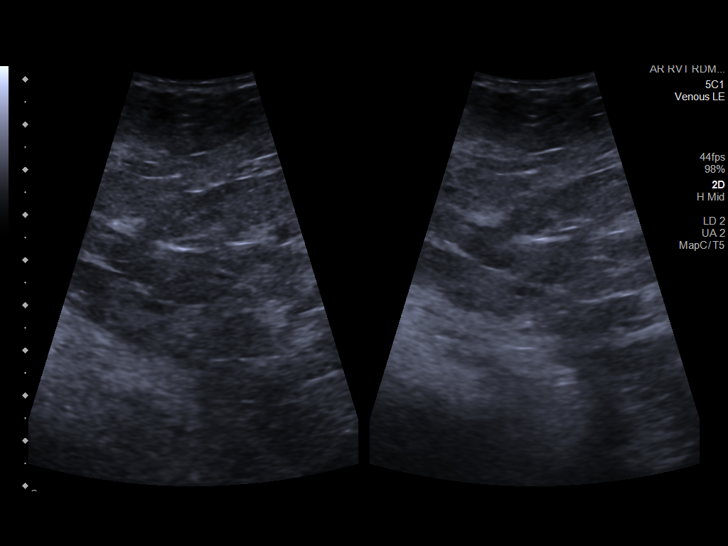
[im 46/56]
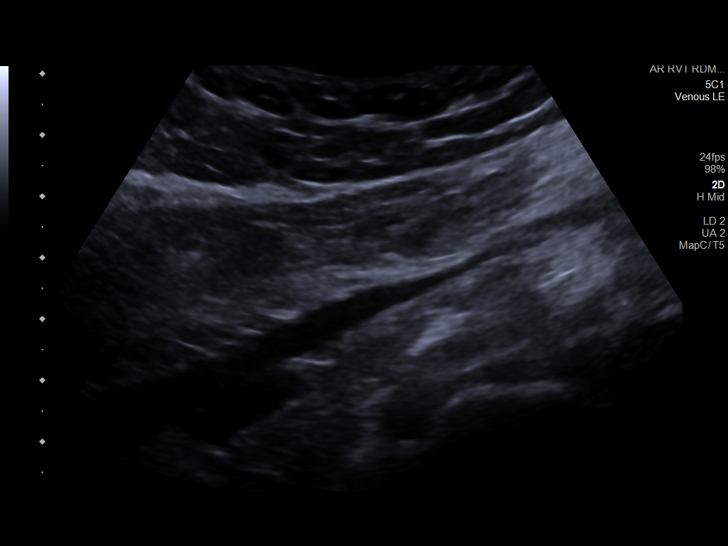
[im 51/56]
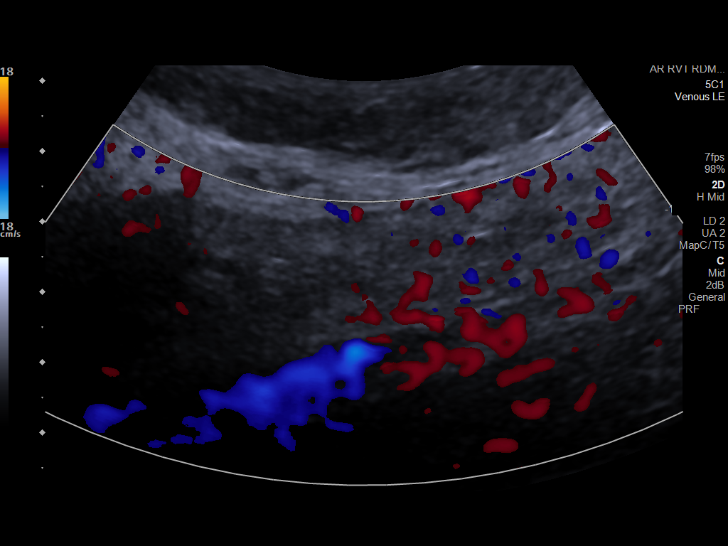
[im 56/56]
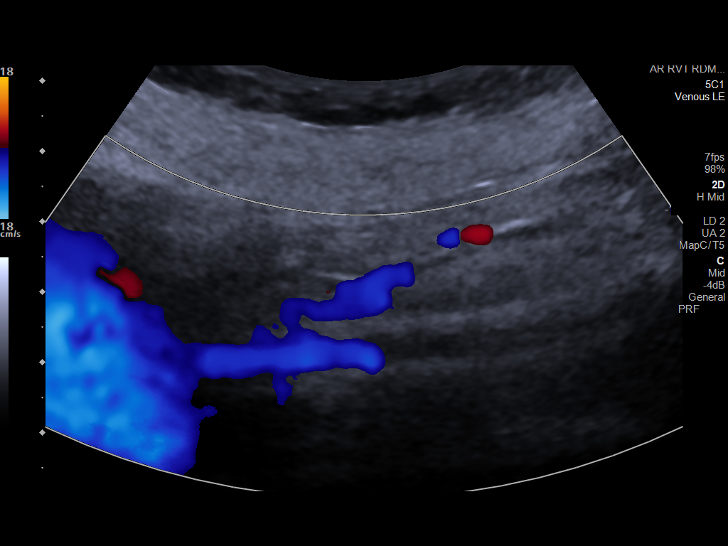

[13 of 24 positions shown; findings below may reference images not displayed]

FINDINGS: Very limited exam because of obese body habitus.

RIGHT LOWER EXTREMITY

Common Femoral Vein: No evidence of thrombus. Normal
compressibility, respiratory phasicity and response to augmentation.

Saphenofemoral Junction: No evidence of thrombus. Normal
compressibility and flow on color Doppler imaging.

Profunda Femoral Vein: No evidence of thrombus. Normal
compressibility and flow on color Doppler imaging.

Femoral Vein: No evidence of thrombus. Normal compressibility,
respiratory phasicity and response to augmentation.

Popliteal Vein: No evidence of thrombus. Normal compressibility,
respiratory phasicity and response to augmentation.

Calf Veins: No evidence of thrombus. Normal compressibility and flow
on color Doppler imaging.

LEFT LOWER EXTREMITY

Common Femoral Vein: No evidence of thrombus. Normal
compressibility, respiratory phasicity and response to augmentation.

Saphenofemoral Junction: No evidence of thrombus. Normal
compressibility and flow on color Doppler imaging.

Profunda Femoral Vein: No evidence of thrombus. Normal
compressibility and flow on color Doppler imaging.

Femoral Vein: No evidence of thrombus. Normal compressibility,
respiratory phasicity and response to augmentation.

Popliteal Vein: No evidence of thrombus. Normal compressibility,
respiratory phasicity and response to augmentation.

Calf Veins: No evidence of thrombus. Normal compressibility and flow
on color Doppler imaging.
IMPRESSION: Very limited exam because of body habitus. No significant occlusive
or acute DVT visualized in either extremity.
